# Patient Record
Sex: Female | Born: 1970 | Race: White | Hispanic: No | Marital: Married | Smoking: Never smoker
Health system: Southern US, Community
[De-identification: ages and names within clinical notes are randomized; demographics above are authoritative.]

## PROBLEM LIST (undated history)

## (undated) DIAGNOSIS — E11610 Type 2 diabetes mellitus with diabetic neuropathic arthropathy: Secondary | ICD-10-CM

## (undated) DIAGNOSIS — F419 Anxiety disorder, unspecified: Secondary | ICD-10-CM

## (undated) DIAGNOSIS — H269 Unspecified cataract: Secondary | ICD-10-CM

## (undated) DIAGNOSIS — I10 Essential (primary) hypertension: Secondary | ICD-10-CM

## (undated) DIAGNOSIS — Z789 Other specified health status: Secondary | ICD-10-CM

## (undated) DIAGNOSIS — R112 Nausea with vomiting, unspecified: Secondary | ICD-10-CM

## (undated) DIAGNOSIS — Z9889 Other specified postprocedural states: Secondary | ICD-10-CM

## (undated) DIAGNOSIS — E119 Type 2 diabetes mellitus without complications: Secondary | ICD-10-CM

## (undated) DIAGNOSIS — Z87442 Personal history of urinary calculi: Secondary | ICD-10-CM

## (undated) HISTORY — DX: Unspecified cataract: H26.9

## (undated) HISTORY — PX: NO PAST SURGERIES: SHX2092

## (undated) HISTORY — DX: Type 2 diabetes mellitus without complications: E11.9

## (undated) HISTORY — DX: Type 2 diabetes mellitus with diabetic neuropathic arthropathy: E11.610

## (undated) HISTORY — PX: CATARACT EXTRACTION: SUR2

---

## 1999-05-09 ENCOUNTER — Other Ambulatory Visit: Admission: RE | Admit: 1999-05-09 | Discharge: 1999-05-09 | Payer: Self-pay | Admitting: Obstetrics and Gynecology

## 2004-05-25 ENCOUNTER — Emergency Department (HOSPITAL_COMMUNITY): Admission: EM | Admit: 2004-05-25 | Discharge: 2004-05-26 | Payer: Self-pay | Admitting: Emergency Medicine

## 2017-08-30 ENCOUNTER — Encounter (HOSPITAL_COMMUNITY): Payer: Self-pay | Admitting: Emergency Medicine

## 2017-08-30 ENCOUNTER — Emergency Department (HOSPITAL_COMMUNITY)
Admission: EM | Admit: 2017-08-30 | Discharge: 2017-08-30 | Discharge status: Against Medical Advice | Payer: Self-pay | Source: Home / Self Care

## 2017-08-30 ENCOUNTER — Ambulatory Visit (INDEPENDENT_AMBULATORY_CARE_PROVIDER_SITE_OTHER): Payer: 59

## 2017-08-30 ENCOUNTER — Emergency Department (HOSPITAL_COMMUNITY)
Admission: EM | Admit: 2017-08-30 | Discharge: 2017-08-30 | Disposition: A | Discharge status: Home / Self Care | Payer: No Typology Code available for payment source | Attending: Emergency Medicine | Admitting: Emergency Medicine

## 2017-08-30 ENCOUNTER — Other Ambulatory Visit: Payer: Self-pay

## 2017-08-30 ENCOUNTER — Ambulatory Visit (HOSPITAL_COMMUNITY)
Admission: EM | Admit: 2017-08-30 | Discharge: 2017-08-30 | Disposition: A | Discharge status: Home / Self Care | Payer: 59 | Source: Home / Self Care | Attending: Family Medicine | Admitting: Family Medicine

## 2017-08-30 DIAGNOSIS — S42201A Unspecified fracture of upper end of right humerus, initial encounter for closed fracture: Secondary | ICD-10-CM

## 2017-08-30 DIAGNOSIS — W19XXXA Unspecified fall, initial encounter: Secondary | ICD-10-CM

## 2017-08-30 DIAGNOSIS — Y92018 Other place in single-family (private) house as the place of occurrence of the external cause: Secondary | ICD-10-CM | POA: Insufficient documentation

## 2017-08-30 DIAGNOSIS — Y9389 Activity, other specified: Secondary | ICD-10-CM | POA: Diagnosis not present

## 2017-08-30 DIAGNOSIS — S4992XA Unspecified injury of left shoulder and upper arm, initial encounter: Secondary | ICD-10-CM | POA: Diagnosis present

## 2017-08-30 DIAGNOSIS — S42352A Displaced comminuted fracture of shaft of humerus, left arm, initial encounter for closed fracture: Secondary | ICD-10-CM | POA: Insufficient documentation

## 2017-08-30 DIAGNOSIS — S42202A Unspecified fracture of upper end of left humerus, initial encounter for closed fracture: Secondary | ICD-10-CM

## 2017-08-30 DIAGNOSIS — Y999 Unspecified external cause status: Secondary | ICD-10-CM | POA: Diagnosis not present

## 2017-08-30 DIAGNOSIS — W01198A Fall on same level from slipping, tripping and stumbling with subsequent striking against other object, initial encounter: Secondary | ICD-10-CM | POA: Diagnosis not present

## 2017-08-30 MED ORDER — FENTANYL CITRATE (PF) 100 MCG/2ML IJ SOLN
50.0000 ug | Freq: Once | INTRAMUSCULAR | Status: AC
  Administered 2017-08-30: 50 ug via INTRAVENOUS
  Filled 2017-08-30: qty 2

## 2017-08-30 MED ORDER — PROMETHAZINE HCL 25 MG/ML IJ SOLN
12.5000 mg | Freq: Once | INTRAMUSCULAR | Status: DC
Start: 2017-08-30 — End: 2017-08-31
  Filled 2017-08-30: qty 1

## 2017-08-30 MED ORDER — MELOXICAM 15 MG PO TABS: 15.0000 mg | ORAL_TABLET | Freq: Every day | ORAL | 0 refills | Status: DC

## 2017-08-30 MED ORDER — OXYCODONE-ACETAMINOPHEN 5-325 MG PO TABS: 1.0000 | ORAL_TABLET | ORAL | 0 refills | Status: DC | PRN

## 2017-08-30 MED ORDER — ONDANSETRON HCL 4 MG/2ML IJ SOLN
4.0000 mg | Freq: Once | INTRAMUSCULAR | Status: AC
  Administered 2017-08-30: 4 mg via INTRAVENOUS
  Filled 2017-08-30: qty 2

## 2017-08-30 NOTE — ED Provider Notes (Signed)
[facility]    CSN: [id number] Arrival date & time: 08/30/17  1744     History   Chief Complaint Chief Complaint  Patient presents with  . Fall  . Arm Pain    HPI Becky MessierJulie S Townsend is a 47 y.o. female.   47 yo female here for fall onto left shoulder while doing laundry. She has been unable to move her arm due to pain since the fall. She has not taken anything for pain. No previous shoulder injury. Denies loss of sensation or muscle weakness.      History reviewed. No pertinent past medical history.  There are no active problems to display for this patient.   History reviewed. No pertinent surgical history.  OB History   None      Home Medications    Prior to Admission medications   Not on File    Family History History reviewed. No pertinent family history.  Social History Social History   Tobacco Use  . Smoking status: Never Smoker  . Smokeless tobacco: Never Used  Substance Use Topics  . Alcohol use: Never    Frequency: Never  . Drug use: Never     Allergies   Patient has no known allergies.   Review of Systems Review of Systems  Constitutional: Negative for activity change and appetite change.  HENT: Negative for congestion and dental problem.   Eyes: Negative for discharge.  Respiratory: Negative for apnea and choking.   Cardiovascular: Negative for chest pain and leg swelling.  Gastrointestinal: Negative for abdominal distention and abdominal pain.  Endocrine: Negative for cold intolerance and heat intolerance.  Genitourinary: Negative for difficulty urinating and dyspareunia.  Musculoskeletal: Negative for arthralgias and back pain.  Neurological: Negative for dizziness and headaches.  Hematological: Negative for adenopathy. Does not bruise/bleed easily.     Physical Exam Triage Vital Signs ED Triage Vitals  Enc Vitals Group     BP      Pulse      Resp      Temp      Temp src      SpO2      Weight      Height      Head Circumference      Peak Flow      Pain Score      Pain Loc      Pain Edu?      Excl. in GC?    No data found.  Updated Vital Signs BP 135/87 (BP Location: Right Arm)   Pulse (!) 106   Temp 98.9 F (37.2 C) (Oral)   Resp 18   SpO2 97%   Visual Acuity Right Eye Distance:   Left Eye Distance:   Bilateral Distance:    Right Eye Near:   Left Eye Near:    Bilateral Near:     Physical Exam  Constitutional: She is oriented to person, place, and time. She appears well-developed and well-nourished.  HENT:  Head: Normocephalic and atraumatic.  Eyes: Pupils are equal, round, and reactive to light. EOM are normal.  Neck: Normal range of motion. Neck supple.  Pulmonary/Chest: Effort normal. No respiratory distress.  Musculoskeletal:  Left shoulder: patient will not allow any movement of shoulder due to pain. The small amount of abduction I was able to create caused a loud "clunk" the patient is holding her arm close to her body and internally rotated. There is tenderness to palpation along the anterior shoulder  Neurological: She is  alert and oriented to person, place, and time.  Skin: Skin is warm and dry.  Psychiatric: She has a normal mood and affect. Her behavior is normal.     UC Treatments / Results  Labs (all labs ordered are listed, but only abnormal results are displayed) Labs Reviewed - No data to display  EKG None Radiology No results found.  Procedures Procedures (including critical care time)  Medications Ordered in UC Medications - No data to display   Initial Impression / Assessment and Plan / UC Course  I have reviewed the triage vital signs and the nursing notes.  Pertinent labs & imaging results that were available during my care of the patient were reviewed by me and considered in my medical decision making (see chart for details).     1. Humerus fracture- complex, so will send to ED for further evaluation and treatment  Final Clinical  Impressions(s) / UC Diagnoses   Final diagnoses:  None    ED Discharge Orders    None       Controlled Substance Prescriptions [place] Controlled Substance Registry consulted? Not Applicable   Rolm Bookbinder, DO 08/30/17 1851

## 2017-08-30 NOTE — ED Triage Notes (Signed)
Pt states she was in her laundry room and fell on her left arm  [facility] and told her she has a fractured humerous and sent her here  Pt has had 4 advil for pain

## 2017-08-30 NOTE — ED Triage Notes (Signed)
Pt sts left arm pain since falling today; pt unable to move arm due to pain

## 2017-08-30 NOTE — ED Provider Notes (Signed)
Medical screening examination/treatment/procedure(s) were conducted as a shared visit with non-physician practitioner(s) and myself.  I personally evaluated the patient during the encounter. Briefly, the patient is a 47 y.o. female here with right humeral shaft fracture following a mechanical fall.  Denies any other trauma.  No other trauma noted on exam.  Case discussed with Dr Lequita HaltAluisio who recommended co-op splint and clinic follow-up this week. The patient appears reasonably screened and/or stabilized for discharge and I doubt any other medical condition or other [facility] requiring further screening, evaluation, or treatment in the ED at this time prior to discharge. The patient is safe for discharge with strict return precautions. .    EKG Interpretation None           Cardama, Amadeo GarnetPedro Eduardo, MD 08/31/17 [phone]

## 2017-08-30 NOTE — Discharge Instructions (Addendum)
Contact a health care provider if: °You have any new pain, swelling, or bruising. °Your pain, swelling, and bruising do not improve. °Your cast, splint, or sling becomes loose or damaged. °Get help right away if: °Your skin or fingers on your injured arm turn blue or gray. °Your arm feels cold or numb. °You have severe pain in your injured arm. °

## 2017-08-30 NOTE — ED Provider Notes (Signed)
[facility]-EMERGENCY DEPT Provider Note   CSN: [id number] Arrival date & time: 08/30/17  1915     History   Chief Complaint Chief Complaint  Patient presents with  . Arm Injury    HPI Becky Townsend is a 47 y.o. female who presents the emergency department with chief complaint of right humeral fracture.  Patient slipped in her laundry room this evening and fell onto her left outstretched hand.  She states that when she got back up she was unable to move her left arm.  She denies numbness, tingling.  She is a confirmed fracture at the urgent care.  Lose consciousness.  HPI  History reviewed. No pertinent past medical history.  There are no active problems to display for this patient.   History reviewed. No pertinent surgical history.   OB History   None      Home Medications    Prior to Admission medications   Not on File    Family History Family History  Problem Relation Age of Onset  . Hypertension Father     Social History Social History   Tobacco Use  . Smoking status: Never Smoker  . Smokeless tobacco: Never Used  Substance Use Topics  . Alcohol use: Never    Frequency: Never  . Drug use: Never     Allergies   Patient has no known allergies.   Review of Systems Review of Systems  Ten systems reviewed and are negative for acute change, except as noted in the HPI.   Physical Exam Updated Vital Signs BP (!) 137/97 (BP Location: Right Arm)   Pulse (!) 112   Temp 98.5 F (36.9 C) (Oral)   Resp 16   Ht 5\' 1"  (1.549 m)   Wt 102.1 kg (225 lb)   LMP 08/08/2017 (Approximate)   SpO2 96%   BMI 42.51 kg/m   Physical Exam  Constitutional: She is oriented to person, place, and time. She appears well-developed and well-nourished. No distress.  HENT:  Head: Normocephalic and atraumatic.  Eyes: Conjunctivae are normal. No scleral icterus.  Neck: Normal range of motion.  Cardiovascular: Normal rate, regular rhythm and  normal heart sounds. Exam reveals no gallop and no friction rub.  No murmur heard. Pulmonary/Chest: Effort normal and breath sounds normal. No respiratory distress.  Abdominal: Soft. Bowel sounds are normal. She exhibits no distension and no mass. There is no tenderness. There is no guarding.  Musculoskeletal:  Left upper arm with significant swelling and tenderness.  Exquisitely tender to palpation without overt deformity.  Normal left radial pulse, no bony tenderness to palpation of the left elbow, normal wrist and finger movement  Neurological: She is alert and oriented to person, place, and time.  Skin: Skin is warm and dry. She is not diaphoretic.  Psychiatric: Her behavior is normal.  Nursing note and vitals reviewed.    ED Treatments / Results  Labs (all labs ordered are listed, but only abnormal results are displayed) Labs Reviewed - No data to display  EKG None  Radiology Dg Shoulder Left  Result Date: 08/30/2017 CLINICAL DATA:  Fall with left shoulder pain EXAM: LEFT SHOULDER - 2+ VIEW COMPARISON:  None. FINDINGS: AC joint is intact. Acute, comminuted fracture involving the proximal to midshaft of the humerus. No significant angulation. Just under 1 bone with of displacement of distal fracture fragment away from midline. Fracture fragments are separated by approximately 9 mm. IMPRESSION: Acute, comminuted and displaced fracture involving the proximal to midshaft  of the left humerus. Electronically Signed   By: Jasmine PangKim  Fujinaga M.D.   On: 08/30/2017 19:28    Procedures Procedures (including critical care time)  SPLINT APPLICATION Date/Time: 11:07 PM Authorized by: Arthor CaptainAbigail Alzena Gerber Consent: Verbal consent obtained. Risks and benefits: risks, benefits and alternatives were discussed Consent given by: patient Splint applied by: orthopedic technician Location details: left arm Splint type: coaptation Supplies used: fiberglass Post-procedure: The splinted body part was  neurovascularly unchanged following the procedure. Patient tolerance: Patient tolerated the procedure well with no immediate complications.    Medications Ordered in ED Medications - No data to display   Initial Impression / Assessment and Plan / ED Course  I have reviewed the triage vital signs and the nursing notes.  Pertinent labs & imaging results that were available during my care of the patient were reviewed by me and considered in my medical decision making (see chart for details).     Patient placed in a coaptation splint.  Spoke with Dr. Lequita HaltAluisio who recommends outpatient follow-up. The patient will be discharged with sling and pain medication. Discussed return recautions  Final Clinical Impressions(s) / ED Diagnoses   Final diagnoses:  None    ED Discharge Orders    None       Arthor CaptainHarris, Carlton Sweaney, PA-C 08/30/17 2309

## 2018-06-20 NOTE — Pre-Procedure Instructions (Signed)
Becky Townsend  06/20/2018      [id number] [place] Otto, [place] - [address] AT [address] [place] [address] Phone: [phone] Fax: [phone]    Your procedure is scheduled on Tuesday, Feb. 4th   Report to [facility] Admitting at 10:45 AM             (posted surgery time 12:45p - 2:45p)   Call this number if you have problems the morning of surgery:  [phone]   Remember:   Do not eat any foods or drink any liquids after midnight, Monday.              7 days prior to surgery, STOP TAKING any Vitamins, Herbal Supplements, Anti=inflammatories, Blood Thinners.    Take these medicines the morning of surgery with A SIP OF WATER : nothing    Do not wear jewelry, make-up or nail polish.  Do not wear lotions, powders, or perfumes, or deodorant.  Do not shave 48 hours prior to surgery.   Do not bring valuables to the hospital.  [facility] is not responsible for any belongings or valuables.  Contacts, dentures or bridgework may not be worn into surgery.  Leave your suitcase in the car.  After surgery it may be brought to your room.  For patients admitted to the hospital, discharge time will be determined by your treatment team.  Patients discharged the day of surgery will not be allowed to drive home, and will need someone to stay with you for the first 24 hrs.   Please read over the following fact sheets that you were given. Pain Booklet and Surgical Site Infection Prevention       [facility]- Preparing For Surgery  Before surgery, you can play an important role. Because skin is not sterile, your skin needs to be as free of germs as possible. You can reduce the number of germs on your skin by washing with CHG (chlorahexidine gluconate) Soap before surgery.  CHG is an antiseptic cleaner which kills germs and bonds with the skin to continue killing germs even after washing.     Oral Hygiene is also important to reduce your risk of infection.    Remember - BRUSH YOUR TEETH THE MORNING OF SURGERY WITH YOUR REGULAR TOOTHPASTE  Please do not use if you have an allergy to CHG or antibacterial soaps. If your skin becomes reddened/irritated stop using the CHG.  Do not shave (including legs and underarms) for at least 48 hours prior to first CHG shower. It is OK to shave your face.  Please follow these instructions carefully.   1. Shower the NIGHT BEFORE SURGERY and the MORNING OF SURGERY with CHG.   2. If you chose to wash your hair, wash your hair first as usual with your normal shampoo.  3. After you shampoo, rinse your hair and body thoroughly to remove the shampoo.  4. Use CHG as you would any other liquid soap. You can apply CHG directly to the skin and wash gently with a scrungie or a clean washcloth.   5. Apply the CHG Soap to your body ONLY FROM THE NECK DOWN.  Do not use on open wounds or open sores. Avoid contact with your eyes, ears, mouth and genitals (private parts). Wash Face and genitals (private parts)  with your normal soap.  6. Wash thoroughly, paying special attention to the area where  your surgery will be performed.  7. Thoroughly rinse your body with warm water from the neck down.  8. DO NOT shower/wash with your normal soap after using and rinsing off the CHG Soap.  9. Pat yourself dry with a CLEAN TOWEL.  10. Wear CLEAN PAJAMAS to bed the night before surgery, wear comfortable clothes the morning of surgery  11. Place CLEAN SHEETS on your bed the night of your first shower and DO NOT SLEEP WITH PETS.  Day of Surgery:  Do not apply any deodorants/lotions.  Please wear clean clothes to the hospital/surgery center.    Remember to brush your teeth WITH YOUR REGULAR TOOTHPASTE.  PLEASE RE-READ OVER THESE FACT SHEETS

## 2018-06-21 ENCOUNTER — Encounter (HOSPITAL_COMMUNITY): Payer: Self-pay

## 2018-06-21 ENCOUNTER — Other Ambulatory Visit: Payer: Self-pay

## 2018-06-21 ENCOUNTER — Encounter (HOSPITAL_COMMUNITY)
Admission: RE | Admit: 2018-06-21 | Discharge: 2018-06-21 | Disposition: A | Discharge status: Home / Self Care | Payer: 59 | Source: Ambulatory Visit | Attending: Orthopedic Surgery | Admitting: Orthopedic Surgery

## 2018-06-21 DIAGNOSIS — Z01812 Encounter for preprocedural laboratory examination: Secondary | ICD-10-CM | POA: Insufficient documentation

## 2018-06-21 HISTORY — DX: Nausea with vomiting, unspecified: R11.2

## 2018-06-21 HISTORY — DX: Anxiety disorder, unspecified: F41.9

## 2018-06-21 HISTORY — DX: Personal history of urinary calculi: Z87.442

## 2018-06-21 HISTORY — DX: Other specified postprocedural states: Z98.890

## 2018-06-21 HISTORY — DX: Other specified health status: Z78.9

## 2018-06-21 LAB — CBC
HCT: 41.6 % (ref 36.0–46.0)
Hemoglobin: 13.9 g/dL (ref 12.0–15.0)
MCH: 29.1 pg (ref 26.0–34.0)
MCHC: 33.4 g/dL (ref 30.0–36.0)
MCV: 87.2 fL (ref 80.0–100.0)
NRBC: 0 % (ref 0.0–0.2)
PLATELETS: 235 10*3/uL (ref 150–400)
RBC: 4.77 MIL/uL (ref 3.87–5.11)
RDW: 13.7 % (ref 11.5–15.5)
WBC: 8.2 10*3/uL (ref 4.0–10.5)

## 2018-06-27 ENCOUNTER — Encounter (HOSPITAL_COMMUNITY): Payer: Self-pay | Admitting: Registered Nurse

## 2018-06-28 ENCOUNTER — Encounter (HOSPITAL_COMMUNITY): Payer: Self-pay | Admitting: *Deleted

## 2018-06-28 ENCOUNTER — Encounter (HOSPITAL_COMMUNITY)
Admission: RE | Disposition: A | Discharge status: Home / Self Care | Payer: Self-pay | Source: Ambulatory Visit | Attending: Orthopedic Surgery

## 2018-06-28 ENCOUNTER — Observation Stay (HOSPITAL_COMMUNITY)
Admission: RE | Admit: 2018-06-28 | Discharge: 2018-06-29 | Disposition: A | Discharge status: Home / Self Care | Payer: No Typology Code available for payment source | Source: Ambulatory Visit | Attending: Orthopedic Surgery | Admitting: Orthopedic Surgery

## 2018-06-28 ENCOUNTER — Ambulatory Visit (HOSPITAL_COMMUNITY): Payer: No Typology Code available for payment source | Admitting: Registered Nurse

## 2018-06-28 ENCOUNTER — Ambulatory Visit (HOSPITAL_COMMUNITY): Payer: No Typology Code available for payment source

## 2018-06-28 ENCOUNTER — Other Ambulatory Visit: Payer: Self-pay

## 2018-06-28 DIAGNOSIS — Z885 Allergy status to narcotic agent status: Secondary | ICD-10-CM | POA: Insufficient documentation

## 2018-06-28 DIAGNOSIS — S42302K Unspecified fracture of shaft of humerus, left arm, subsequent encounter for fracture with nonunion: Secondary | ICD-10-CM | POA: Diagnosis present

## 2018-06-28 DIAGNOSIS — Y990 Civilian activity done for income or pay: Secondary | ICD-10-CM | POA: Diagnosis not present

## 2018-06-28 DIAGNOSIS — Z6841 Body Mass Index (BMI) 40.0 and over, adult: Secondary | ICD-10-CM | POA: Insufficient documentation

## 2018-06-28 DIAGNOSIS — Z419 Encounter for procedure for purposes other than remedying health state, unspecified: Secondary | ICD-10-CM

## 2018-06-28 DIAGNOSIS — Z79899 Other long term (current) drug therapy: Secondary | ICD-10-CM | POA: Insufficient documentation

## 2018-06-28 DIAGNOSIS — S42362K Displaced segmental fracture of shaft of humerus, left arm, subsequent encounter for fracture with nonunion: Secondary | ICD-10-CM | POA: Diagnosis present

## 2018-06-28 DIAGNOSIS — S42352K Displaced comminuted fracture of shaft of humerus, left arm, subsequent encounter for fracture with nonunion: Secondary | ICD-10-CM | POA: Diagnosis present

## 2018-06-28 HISTORY — PX: ORIF HUMERUS FRACTURE: SHX2126

## 2018-06-28 LAB — POCT PREGNANCY, URINE: Preg Test, Ur: NEGATIVE

## 2018-06-28 SURGERY — OPEN REDUCTION INTERNAL FIXATION (ORIF) PROXIMAL HUMERUS FRACTURE
Anesthesia: General | Laterality: Left

## 2018-06-28 MED ORDER — ACETAMINOPHEN 650 MG RE SUPP: 650.0000 mg | Freq: Four times a day (QID) | RECTAL | Status: DC | PRN

## 2018-06-28 MED ORDER — SODIUM CHLORIDE 0.9 % IR SOLN
Status: DC | PRN
  Administered 2018-06-28: 1000 mL

## 2018-06-28 MED ORDER — OXYCODONE HCL 5 MG PO TABS: 5.0000 mg | ORAL_TABLET | ORAL | Status: DC | PRN

## 2018-06-28 MED ORDER — CHLORHEXIDINE GLUCONATE 4 % EX LIQD: 60.0000 mL | Freq: Once | CUTANEOUS | Status: DC

## 2018-06-28 MED ORDER — ACETAMINOPHEN 325 MG PO TABS: 650.0000 mg | ORAL_TABLET | Freq: Four times a day (QID) | ORAL | Status: DC | PRN

## 2018-06-28 MED ORDER — DEXAMETHASONE SODIUM PHOSPHATE 10 MG/ML IJ SOLN
INTRAMUSCULAR | Status: DC | PRN
  Administered 2018-06-28: 10 mg via INTRAVENOUS

## 2018-06-28 MED ORDER — MIDAZOLAM HCL 2 MG/2ML IJ SOLN
2.0000 mg | Freq: Once | INTRAMUSCULAR | Status: AC
  Administered 2018-06-28: 2 mg via INTRAVENOUS

## 2018-06-28 MED ORDER — LACTATED RINGERS IV SOLN
INTRAVENOUS | Status: DC
  Administered 2018-06-28: 11:00:00 via INTRAVENOUS

## 2018-06-28 MED ORDER — BUPIVACAINE-EPINEPHRINE (PF) 0.5% -1:200000 IJ SOLN
INTRAMUSCULAR | Status: DC | PRN
  Administered 2018-06-28: 20 mL via PERINEURAL

## 2018-06-28 MED ORDER — METHOCARBAMOL 500 MG PO TABS: 500.0000 mg | ORAL_TABLET | Freq: Four times a day (QID) | ORAL | Status: DC | PRN

## 2018-06-28 MED ORDER — ACETAMINOPHEN 500 MG PO TABS: 1000.0000 mg | ORAL_TABLET | Freq: Four times a day (QID) | ORAL | Status: DC | PRN

## 2018-06-28 MED ORDER — FENTANYL CITRATE (PF) 250 MCG/5ML IJ SOLN
INTRAMUSCULAR | Status: AC
  Filled 2018-06-28: qty 5

## 2018-06-28 MED ORDER — MORPHINE SULFATE (PF) 2 MG/ML IV SOLN: 2.0000 mg | INTRAVENOUS | Status: DC | PRN

## 2018-06-28 MED ORDER — HYDROMORPHONE HCL 1 MG/ML IJ SOLN: 0.2500 mg | INTRAMUSCULAR | Status: DC | PRN

## 2018-06-28 MED ORDER — CEFAZOLIN SODIUM-DEXTROSE 2-4 GM/100ML-% IV SOLN
2.0000 g | INTRAVENOUS | Status: AC
  Administered 2018-06-28: 2 g via INTRAVENOUS
  Filled 2018-06-28: qty 100

## 2018-06-28 MED ORDER — ONDANSETRON HCL 4 MG/2ML IJ SOLN
INTRAMUSCULAR | Status: DC | PRN
  Administered 2018-06-28: 4 mg via INTRAVENOUS

## 2018-06-28 MED ORDER — MIDAZOLAM HCL 2 MG/2ML IJ SOLN
INTRAMUSCULAR | Status: AC
  Administered 2018-06-28: 2 mg via INTRAVENOUS
  Filled 2018-06-28: qty 2

## 2018-06-28 MED ORDER — ONDANSETRON HCL 4 MG/2ML IJ SOLN
4.0000 mg | Freq: Four times a day (QID) | INTRAMUSCULAR | Status: DC | PRN
  Filled 2018-06-28: qty 2

## 2018-06-28 MED ORDER — PROPOFOL 10 MG/ML IV BOLUS
INTRAVENOUS | Status: AC
  Filled 2018-06-28: qty 20

## 2018-06-28 MED ORDER — PROPOFOL 10 MG/ML IV BOLUS
INTRAVENOUS | Status: DC | PRN
  Administered 2018-06-28: 150 mg via INTRAVENOUS
  Administered 2018-06-28: 50 mg via INTRAVENOUS

## 2018-06-28 MED ORDER — BUPIVACAINE-EPINEPHRINE 0.5% -1:200000 IJ SOLN
INTRAMUSCULAR | Status: AC
  Filled 2018-06-28: qty 1

## 2018-06-28 MED ORDER — ROCURONIUM BROMIDE 10 MG/ML (PF) SYRINGE
PREFILLED_SYRINGE | INTRAVENOUS | Status: DC | PRN
  Administered 2018-06-28: 50 mg via INTRAVENOUS

## 2018-06-28 MED ORDER — MIDAZOLAM HCL 2 MG/2ML IJ SOLN
INTRAMUSCULAR | Status: AC
  Filled 2018-06-28: qty 2

## 2018-06-28 MED ORDER — ROCURONIUM BROMIDE 50 MG/5ML IV SOSY
PREFILLED_SYRINGE | INTRAVENOUS | Status: AC
  Filled 2018-06-28: qty 10

## 2018-06-28 MED ORDER — ACETAMINOPHEN 500 MG PO TABS
1000.0000 mg | ORAL_TABLET | Freq: Four times a day (QID) | ORAL | Status: DC
  Administered 2018-06-28 – 2018-06-29 (×3): 1000 mg via ORAL
  Filled 2018-06-28 (×3): qty 2

## 2018-06-28 MED ORDER — LIDOCAINE 2% (20 MG/ML) 5 ML SYRINGE
INTRAMUSCULAR | Status: AC
  Filled 2018-06-28: qty 5

## 2018-06-28 MED ORDER — ACETAMINOPHEN 500 MG PO TABS
1000.0000 mg | ORAL_TABLET | Freq: Once | ORAL | Status: AC
  Administered 2018-06-28: 1000 mg via ORAL
  Filled 2018-06-28: qty 2

## 2018-06-28 MED ORDER — BUPIVACAINE-EPINEPHRINE 0.25% -1:200000 IJ SOLN
INTRAMUSCULAR | Status: DC | PRN
  Administered 2018-06-28: 20 mL

## 2018-06-28 MED ORDER — BUPIVACAINE LIPOSOME 1.3 % IJ SUSP
INTRAMUSCULAR | Status: DC | PRN
  Administered 2018-06-28: 10 mL via PERINEURAL

## 2018-06-28 MED ORDER — SODIUM CHLORIDE 0.9 % IV SOLN
INTRAVENOUS | Status: DC | PRN
  Administered 2018-06-28: 25 ug/min via INTRAVENOUS

## 2018-06-28 MED ORDER — FENTANYL CITRATE (PF) 100 MCG/2ML IJ SOLN
INTRAMUSCULAR | Status: AC
  Filled 2018-06-28: qty 2

## 2018-06-28 MED ORDER — ONDANSETRON HCL 4 MG/2ML IJ SOLN
INTRAMUSCULAR | Status: AC
  Filled 2018-06-28: qty 2

## 2018-06-28 MED ORDER — TRANEXAMIC ACID-NACL 1000-0.7 MG/100ML-% IV SOLN
1000.0000 mg | INTRAVENOUS | Status: AC
  Administered 2018-06-28: 1000 mg via INTRAVENOUS
  Filled 2018-06-28: qty 100

## 2018-06-28 MED ORDER — FENTANYL CITRATE (PF) 250 MCG/5ML IJ SOLN
INTRAMUSCULAR | Status: DC | PRN
  Administered 2018-06-28: 50 ug via INTRAVENOUS

## 2018-06-28 MED ORDER — LIDOCAINE 2% (20 MG/ML) 5 ML SYRINGE
INTRAMUSCULAR | Status: DC | PRN
  Administered 2018-06-28: 60 mg via INTRAVENOUS

## 2018-06-28 MED ORDER — METHOCARBAMOL 1000 MG/10ML IJ SOLN
500.0000 mg | Freq: Four times a day (QID) | INTRAVENOUS | Status: DC | PRN
  Filled 2018-06-28: qty 5

## 2018-06-28 MED ORDER — ONDANSETRON HCL 4 MG PO TABS: 4.0000 mg | ORAL_TABLET | Freq: Four times a day (QID) | ORAL | Status: DC | PRN

## 2018-06-28 MED ORDER — TRANEXAMIC ACID 1000 MG/10ML IV SOLN
INTRAVENOUS | Status: DC | PRN
  Administered 2018-06-28: 2000 mg via TOPICAL

## 2018-06-28 MED ORDER — SUGAMMADEX SODIUM 200 MG/2ML IV SOLN
INTRAVENOUS | Status: DC | PRN
  Administered 2018-06-28: 200 mg via INTRAVENOUS

## 2018-06-28 MED ORDER — TRANEXAMIC ACID 1000 MG/10ML IV SOLN
2000.0000 mg | Freq: Once | INTRAVENOUS | Status: DC
  Filled 2018-06-28: qty 20

## 2018-06-28 SURGICAL SUPPLY — 76 items
ALCOHOL 70% 16 OZ (MISCELLANEOUS) ×3 IMPLANT
BANDAGE ELASTIC 4 VELCRO ST LF (GAUZE/BANDAGES/DRESSINGS) ×3 IMPLANT
BIT DRILL 2.5 X LONG (BIT) ×1
BIT DRILL 5/64X5 DISP (BIT) ×3 IMPLANT
BIT DRILL PERC QC 2.8X200 100 (BIT) IMPLANT
BIT DRILL X LONG 2.5 (BIT) IMPLANT
BNDG CMPR MED 10X6 ELC LF (GAUZE/BANDAGES/DRESSINGS) ×1
BNDG ELASTIC 6X10 VLCR STRL LF (GAUZE/BANDAGES/DRESSINGS) ×2 IMPLANT
CLOSURE WOUND 1/2 X4 (GAUZE/BANDAGES/DRESSINGS) ×1
COVER SURGICAL LIGHT HANDLE (MISCELLANEOUS) ×3 IMPLANT
COVER WAND RF STERILE (DRAPES) ×3 IMPLANT
DRAPE INCISE IOBAN 66X45 STRL (DRAPES) ×3 IMPLANT
DRAPE ORTHO SPLIT 77X108 STRL (DRAPES) ×6
DRAPE SURG ORHT 6 SPLT 77X108 (DRAPES) ×2 IMPLANT
DRAPE U-SHAPE 47X51 STRL (DRAPES) ×3 IMPLANT
DRILL BIT QUICK COUP 2.8MM 100 (BIT) ×2
DRILL BIT X LONG 2.5 (BIT) ×3
DRSG ADAPTIC 3X8 NADH LF (GAUZE/BANDAGES/DRESSINGS) ×3 IMPLANT
DRSG PAD ABDOMINAL 8X10 ST (GAUZE/BANDAGES/DRESSINGS) ×3 IMPLANT
DURAPREP 26ML APPLICATOR (WOUND CARE) ×3 IMPLANT
ELECT BLADE 4.0 EZ CLEAN MEGAD (MISCELLANEOUS) ×3
ELECT REM PT RETURN 9FT ADLT (ELECTROSURGICAL) ×3
ELECTRODE BLDE 4.0 EZ CLN MEGD (MISCELLANEOUS) ×1 IMPLANT
ELECTRODE REM PT RTRN 9FT ADLT (ELECTROSURGICAL) ×1 IMPLANT
GAUZE SPONGE 4X4 12PLY STRL (GAUZE/BANDAGES/DRESSINGS) ×3 IMPLANT
GAUZE XEROFORM 5X9 LF (GAUZE/BANDAGES/DRESSINGS) ×2 IMPLANT
GLOVE BIO SURGEON STRL SZ7.5 (GLOVE) ×3 IMPLANT
GLOVE BIOGEL PI IND STRL 8 (GLOVE) ×1 IMPLANT
GLOVE BIOGEL PI INDICATOR 8 (GLOVE) ×2
GOWN STRL REUS W/ TWL LRG LVL3 (GOWN DISPOSABLE) ×1 IMPLANT
GOWN STRL REUS W/ TWL XL LVL3 (GOWN DISPOSABLE) ×2 IMPLANT
GOWN STRL REUS W/TWL LRG LVL3 (GOWN DISPOSABLE) ×3
GOWN STRL REUS W/TWL XL LVL3 (GOWN DISPOSABLE) ×6
HUMERUS PROX LCP 8H 3.5X196 (Shoulder) ×2 IMPLANT
KIT BASIN OR (CUSTOM PROCEDURE TRAY) ×3 IMPLANT
KIT TURNOVER KIT B (KITS) ×3 IMPLANT
MANIFOLD NEPTUNE II (INSTRUMENTS) ×3 IMPLANT
NDL 1/2 CIR MAYO (NEEDLE) ×1 IMPLANT
NDL HYPO 25GX1X1/2 BEV (NEEDLE) ×1 IMPLANT
NEEDLE 1/2 CIR MAYO (NEEDLE) ×3 IMPLANT
NEEDLE HYPO 25GX1X1/2 BEV (NEEDLE) ×3 IMPLANT
NS IRRIG 1000ML POUR BTL (IV SOLUTION) ×3 IMPLANT
PACK SHOULDER (CUSTOM PROCEDURE TRAY) ×3 IMPLANT
PAD ABD 8X10 STRL (GAUZE/BANDAGES/DRESSINGS) ×4 IMPLANT
PAD ARMBOARD 7.5X6 YLW CONV (MISCELLANEOUS) ×6 IMPLANT
SCREW CORTEX 2.0 34MM (Screw) ×2 IMPLANT
SCREW CORTEX 3.5 18MM (Screw) ×2 IMPLANT
SCREW CORTEX 3.5 28MM (Screw) ×2 IMPLANT
SCREW CORTEX 3.5 30MM (Screw) ×2 IMPLANT
SCREW LOCK CORT ST 3.5X18 (Screw) IMPLANT
SCREW LOCK CORT ST 3.5X28 (Screw) IMPLANT
SCREW LOCK CORT ST 3.5X30 (Screw) IMPLANT
SCREW LOCK T15 FT 28X3.5X2.9X (Screw) IMPLANT
SCREW LOCK T15 FT 32X3.5X2.9X (Screw) IMPLANT
SCREW LOCK T15 FT 36X3.5X2.9X (Screw) IMPLANT
SCREW LOCKING 3.5X26 (Screw) ×4 IMPLANT
SCREW LOCKING 3.5X28 (Screw) ×3 IMPLANT
SCREW LOCKING 3.5X32 (Screw) ×6 IMPLANT
SCREW LOCKING 3.5X36 (Screw) ×3 IMPLANT
SCREW LOCKING 3.5X42 (Screw) ×4 IMPLANT
SLING ARM FOAM STRAP LRG (SOFTGOODS) IMPLANT
SPONGE LAP 18X18 X RAY DECT (DISPOSABLE) IMPLANT
SPONGE LAP 4X18 RFD (DISPOSABLE) ×3 IMPLANT
STRIP CLOSURE SKIN 1/2X4 (GAUZE/BANDAGES/DRESSINGS) ×2 IMPLANT
SUCTION FRAZIER HANDLE 10FR (MISCELLANEOUS) ×2
SUCTION TUBE FRAZIER 10FR DISP (MISCELLANEOUS) ×1 IMPLANT
SUT FIBERWIRE #2 38 T-5 BLUE (SUTURE) ×6
SUT MNCRL AB 4-0 PS2 18 (SUTURE) ×3 IMPLANT
SUT VIC AB 2-0 CT1 27 (SUTURE) ×3
SUT VIC AB 2-0 CT1 TAPERPNT 27 (SUTURE) ×1 IMPLANT
SUTURE FIBERWR #2 38 T-5 BLUE (SUTURE) ×2 IMPLANT
SYR CONTROL 10ML LL (SYRINGE) ×3 IMPLANT
TOWEL OR 17X24 6PK STRL BLUE (TOWEL DISPOSABLE) ×3 IMPLANT
TOWEL OR 17X26 10 PK STRL BLUE (TOWEL DISPOSABLE) ×3 IMPLANT
WATER STERILE IRR 1000ML POUR (IV SOLUTION) ×3 IMPLANT
YANKAUER SUCT BULB TIP NO VENT (SUCTIONS) ×3 IMPLANT

## 2018-06-28 NOTE — Anesthesia Procedure Notes (Signed)
Procedure Name: Intubation Date/Time: 06/28/2018 12:42 PM Performed by: Valda Favia, CRNA Pre-anesthesia Checklist: Patient identified, Emergency Drugs available, Suction available and Patient being monitored Patient Re-evaluated:Patient Re-evaluated prior to induction Oxygen Delivery Method: Circle System Utilized Preoxygenation: Pre-oxygenation with 100% oxygen Induction Type: IV induction Ventilation: Mask ventilation without difficulty Laryngoscope Size: Mac and 4 Grade View: Grade I Tube type: Oral Tube size: 7.0 mm Number of attempts: 1 Airway Equipment and Method: Stylet and Oral airway Placement Confirmation: ETT inserted through vocal cords under direct vision,  positive ETCO2 and breath sounds checked- equal and bilateral Secured at: 21 cm Tube secured with: Tape Dental Injury: Teeth and Oropharynx as per pre-operative assessment

## 2018-06-28 NOTE — Op Note (Signed)
Date of Surgery: 06/28/2018  INDICATIONS: Becky Townsend is a 48 y.o.-year-old female with a left comminuted midshaft humerus nonunion.  She is a right-hand-dominant individual who sustained a work-related injury about 9 months ago.  She had a fracture of the left humeral shaft that was comminuted and midshaft that was indicated for closed management.  She opted for closed management.  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  She presents today for nonunion takedown and internal fixation of the humeral shaft.;  The patient did consent to the procedure after discussion of the risks and benefits.  PREOPERATIVE DIAGNOSIS:  Left humeral shaft fracture, nonunion.  POSTOPERATIVE DIAGNOSIS: Same.  PROCEDURE:  1.  Open reduction and internal fixation of chronic nonunited humeral shaft fracture  SURGEON: Maryan Rued, M.D.  ASSIST: Patrick Jupiter, RNFA.  ANESTHESIA:  general, and interscalene  IV FLUIDS AND URINE: See anesthesia.  ESTIMATED BLOOD LOSS: 100 mL.  IMPLANTS: Synthes stainless steel 3.5 mm 8 hole proximal humeral plate. 3.5 mm locking and nonlocking screws.  DRAINS: None  COMPLICATIONS: None.  DESCRIPTION OF PROCEDURE: The patient was brought to the operating room and placed supine on the operating table.  The patient had been signed prior to the procedure and this was documented. The patient had the anesthesia placed by the anesthesiologist.  A time-out was performed to confirm that this was the correct patient, site, side and location. The patient did receive antibiotics prior to the incision and was re-dosed during the procedure as needed at indicated intervals.  A tourniquet was not placed.  The patient had the operative extremity prepped and draped in the standard surgical fashion.      An extended deltopectoral incision was carried out into the anterior lateral aspect of the midshaft humerus.  Dissection was carried down through skin, subcutaneous tissue,  deltopectoral fascia and into the subdeltoid and subpectoralis space proximally.  The clavipectoral fascia was opened and retractors were placed deep.  We then continued distally to the deltoid tuberosity.  Dissection was then carried along the lateral aspect of the biceps brachialis and this was swept medially with blunt retractors.  We then split the brachialis in line with its fibers along the fracture site and distally.  Next we identified the chronic nonunion site of the midshaft of the humerus.  There was abundant fibrous nonunion material interposed between the long oblique fracture fragment.  This was taken down with rondure and Bovie.  Care was taken to avoid neurovascular structures.  We did address some neovascularization with electrocautery to control bleeding.   Next we were able to place 2 point-to-point clamps along the long oblique fracture segment.  The appropriate length plate was selected which was an 8 hole plate.  This was secured proximally with 1.6 mm Kirschner wires and distally with the same.  Fluoroscopy was used to confirm adequacy of the reduction and placement of the plate.  Once we were satisfied with the reduction and plate placement we began to secure the plate to the bone.  We began with placing a 3.5 mm nonlocking screw distal to the fracture and did the same with a nonlocking screw proximal to the fracture.  This reduced the plate nicely to the bone.  We then secured it proximally with locking screws into the humeral head.  We then placed 2 more proximal locking screws into the shaft and then 3 more locking screws distally into the shaft.  These all have excellent bite and purchase.   Finally we  confirmed adequacy of the plate placement and reduction with AP and lateral intraoperative fluoroscopy.  All screw lengths were checked and were appropriate as well as the reduction.  The wound was then copiously irrigated with 1 L of normal saline.  We then closed the fascia  proximally with 0 Vicryl.  We then closed fashion distally with 0 Vicryl.  We placed a 0 Vicryl running stitch in the deep subcutaneous fat layer.  We then placed 2-0 Vicryl into the deep dermal layer and staples were placed for skin.  A standard sterile dressing was applied after the arm was cleaned and dried one final time.  She was awakened from general anesthesia in stable condition.  A sling was applied to the arm.  All counts were correct x2.  POSTOPERATIVE PLAN:   Dore will be nonweightbearing to the left upper extremity.  She will wear her sling for comfort only and may remove it to begin active range of motion and passive range of motion as tolerated at the shoulder and elbow as well as hand and wrist.  She may begin showering immediately with her occlusive dressing.  This will maintain in place until her follow-up appointment.  I will see her back in the office in 2 weeks.  She will be admitted postoperatively under observation status for pain control and postoperative care.

## 2018-06-28 NOTE — Anesthesia Preprocedure Evaluation (Addendum)
Anesthesia Evaluation  Patient identified by MRN, date of birth, ID band Patient awake    Reviewed: Allergy & Precautions, H&P , NPO status , Patient's Chart, lab work & pertinent test results  History of Anesthesia Complications (+) PONV  Airway Mallampati: II  TM Distance: >3 FB Neck ROM: Full    Dental no notable dental hx. (+) Teeth Intact, Dental Advisory Given   Pulmonary neg pulmonary ROS,    Pulmonary exam normal breath sounds clear to auscultation       Cardiovascular negative cardio ROS   Rhythm:Regular Rate:Normal     Neuro/Psych Anxiety negative neurological ROS     GI/Hepatic negative GI ROS, Neg liver ROS,   Endo/Other  Morbid obesity  Renal/GU negative Renal ROS  negative genitourinary   Musculoskeletal   Abdominal   Peds  Hematology negative hematology ROS (+)   Anesthesia Other Findings   Reproductive/Obstetrics negative OB ROS                            Anesthesia Physical Anesthesia Plan  ASA: III  Anesthesia Plan: General   Post-op Pain Management:  Regional for Post-op pain   Induction: Intravenous  PONV Risk Score and Plan: 4 or greater and Ondansetron, Dexamethasone and Midazolam  Airway Management Planned: Oral ETT  Additional Equipment:   Intra-op Plan:   Post-operative Plan: Extubation in OR  Informed Consent: I have reviewed the patients History and Physical, chart, labs and discussed the procedure including the risks, benefits and alternatives for the proposed anesthesia with the patient or authorized representative who has indicated his/her understanding and acceptance.     Dental advisory given  Plan Discussed with: CRNA  Anesthesia Plan Comments:         Anesthesia Quick Evaluation

## 2018-06-28 NOTE — H&P (Signed)
ORTHOPAEDIC H and P  REQUESTING PHYSICIAN: Yolonda Kida, MD  PCP:  Patient, No Pcp Per  Chief Complaint: Left humerus fracture  HPI: Becky Townsend is a 48 y.o. female who complains of persistent pain and dysfunction of the left arm following a humeral shaft comminuted fracture last year.  She opted for nonoperative management on the onset.  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  She presents today for open reduction and internal fixation.  She has no new complaints at this time.  She denies any new pains in fact has had some reduction in pain but continues to have a clicking and catching in the humerus.  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  Past Medical History:  Diagnosis Date  . Anxiety   . History of kidney stones   . Medical history non-contributory   . PONV (postoperative nausea and vomiting)    even though no surgeries is very prone to nausea and vomiting with many pain medications in the past           Past Surgical History:  Procedure Laterality Date  . NO PAST SURGERIES     Social History   Socioeconomic History  . Marital status: Married    Spouse name: Not on file  . Number of children: Not on file  . Years of education: Not on file  . Highest education level: Not on file  Occupational History  . Not on file  Social Needs  . Financial resource strain: Not on file  . Food insecurity:    Worry: Not on file    Inability: Not on file  . Transportation needs:    Medical: Not on file    Non-medical: Not on file  Tobacco Use  . Smoking status: Never Smoker  . Smokeless tobacco: Never Used  Substance and Sexual Activity  . Alcohol use: Never    Frequency: Never  . Drug use: Never  . Sexual activity: Not on file  Lifestyle  . Physical activity:    Days per week: Not on file    Minutes per session: Not on file  . Stress: Not on file  Relationships  . Social connections:    Talks on phone: Not on file    Gets together: Not  on file    Attends religious service: Not on file    Active member of club or organization: Not on file    Attends meetings of clubs or organizations: Not on file    Relationship status: Not on file  Other Topics Concern  . Not on file  Social History Narrative  . Not on file   Family History  Problem Relation Age of Onset  . Hypertension Father    Allergies  Allergen Reactions  . Percocet [Oxycodone-Acetaminophen] Nausea And Vomiting   Prior to Admission medications   Medication Sig Start Date End Date Taking? Authorizing Provider  Calcium Carb-Cholecalciferol (CALCIUM + VITAMIN D3 PO) Take 1 tablet by mouth 2 (two) times daily.   Yes [provider]  ibuprofen (ADVIL,MOTRIN) 200 MG tablet Take 400 mg by mouth every 6 (six) hours as needed for headache or moderate pain.    Yes [provider]   No results found.  Positive ROS: All other systems have been reviewed and were otherwise negative with the exception of those mentioned in the HPI and as above.  Physical Exam: General: Alert, no acute distress Cardiovascular: No pedal edema Respiratory: No cyanosis, no use of  accessory musculature GI: No organomegaly, abdomen is soft and non-tender Skin: No lesions in the area of chief complaint Neurologic: Sensation intact distally Psychiatric: Patient is competent for consent with normal mood and affect Lymphatic: No axillary or cervical lymphadenopathy    Assessment: Left humeral shaft nonunion  Plan: -Our plan today will be for open reduction and internal fixation with compression plating of the humeral shaft.  We again reviewed the risk, benefits, and indications of this procedure at length.  All questions were solicited and answered to her satisfaction.  She would desire to go home postoperatively.  We will monitor her closely intraoperatively and in PACU.  I think if she has adequate pain control and minimal blood loss that would be a reasonable  undertaking.  Otherwise we will keep her overnight for observation and discharge home tomorrow. -She will be nonweightbearing to the left upper extremity following surgery and will be in a sling for comfort.  We will see her back in the office in 2 weeks.    Yolonda Kida, MD Cell [phone]    06/28/2018 12:05 PM

## 2018-06-28 NOTE — Brief Op Note (Signed)
06/28/2018  3:18 PM  PATIENT:  Becky Townsend  47 y.o. female  PRE-OPERATIVE DIAGNOSIS:  Left humerus fracture, nonuinon  POST-OPERATIVE DIAGNOSIS:  Left humerus fracture, nonunion  PROCEDURE:  Procedure(s) with comments: OPEN REDUCTION INTERNAL FIXATION (ORIF) HUMERUS FRACTURE (Left) - 120 mins  SURGEON:  Surgeon(s) and Role:    * Aundria Rud, Noah Delaine, MD - Primary  PHYSICIAN ASSISTANT:   ASSISTANTS: Patrick Jupiter, RNFA   ANESTHESIA:   general with interscalene block  EBL:  100 mL   BLOOD ADMINISTERED:none  DRAINS: none   LOCAL MEDICATIONS USED:  MARCAINE     SPECIMEN:  No Specimen  DISPOSITION OF SPECIMEN:  N/A  COUNTS:  YES  TOURNIQUET:  * No tourniquets in log *  DICTATION: .Note written in EPIC  PLAN OF CARE: Discharge to home after PACU  PATIENT DISPOSITION:  PACU - hemodynamically stable.   Delay start of Pharmacological VTE agent (>24hrs) due to surgical blood loss or risk of bleeding: not applicable

## 2018-06-28 NOTE — Transfer of Care (Signed)
Immediate Anesthesia Transfer of Care Note  Patient: Becky Townsend  Procedure(s) Performed: OPEN REDUCTION INTERNAL FIXATION (ORIF) HUMERUS FRACTURE (Left )  Patient Location: PACU  Anesthesia Type:General and Regional  Level of Consciousness: awake, alert  and oriented  Airway & Oxygen Therapy: Patient Spontanous Breathing and Patient connected to nasal cannula oxygen  Post-op Assessment: Report given to RN and Post -op Vital signs reviewed and stable  Post vital signs: Reviewed and stable  Last Vitals:  Vitals Value Taken Time  BP 150/89 06/28/2018  3:10 PM  Temp    Pulse 85 06/28/2018  3:11 PM  Resp 21 06/28/2018  3:11 PM  SpO2 85 % 06/28/2018  3:11 PM  Vitals shown include unvalidated device data.  Last Pain:  Vitals:   06/28/18 1107  TempSrc:   PainSc: 0-No pain      Patients Stated Pain Goal: 2 (06/28/18 1107)  Complications: No apparent anesthesia complications

## 2018-06-28 NOTE — Anesthesia Procedure Notes (Signed)
Anesthesia Regional Block: Interscalene brachial plexus block   Pre-Anesthetic Checklist: ,, timeout performed, Correct Patient, Correct Site, Correct Laterality, Correct Procedure, Correct Position, site marked, Risks and benefits discussed, pre-op evaluation,  At surgeon's request and post-op pain management  Laterality: Left  Prep: Maximum Sterile Barrier Precautions used, chloraprep       Needles:  Injection technique: Single-shot  Needle Type: Echogenic Stimulator Needle     Needle Length: 5cm  Needle Gauge: 22     Additional Needles:   Procedures:, nerve stimulator,,, ultrasound used (permanent image in chart),,,,   Nerve Stimulator or Paresthesia:  Response: Biceps response,   Additional Responses:   Narrative:  Start time: 06/28/2018 12:05 PM End time: 06/28/2018 12:15 PM Injection made incrementally with aspirations every 5 mL. Anesthesiologist: Gaynelle Adu, MD  Additional Notes: 2% Lidocaine skin wheel.

## 2018-06-28 NOTE — Progress Notes (Addendum)
Assisted Dr. Aleene Davidson with left interscalene block. Side rails up, monitors on throughout procedure. See vital signs in flow sheet. Tolerated Procedure well.

## 2018-06-29 ENCOUNTER — Encounter (HOSPITAL_COMMUNITY): Payer: Self-pay | Admitting: Orthopedic Surgery

## 2018-06-29 DIAGNOSIS — S42302K Unspecified fracture of shaft of humerus, left arm, subsequent encounter for fracture with nonunion: Secondary | ICD-10-CM | POA: Diagnosis not present

## 2018-06-29 LAB — CBC
HCT: 34.8 % — ABNORMAL LOW (ref 36.0–46.0)
Hemoglobin: 12.5 g/dL (ref 12.0–15.0)
MCH: 30 pg (ref 26.0–34.0)
MCHC: 35.9 g/dL (ref 30.0–36.0)
MCV: 83.7 fL (ref 80.0–100.0)
Platelets: 194 10*3/uL (ref 150–400)
RBC: 4.16 MIL/uL (ref 3.87–5.11)
RDW: 13.9 % (ref 11.5–15.5)
WBC: 17.3 10*3/uL — ABNORMAL HIGH (ref 4.0–10.5)
nRBC: 0 % (ref 0.0–0.2)

## 2018-06-29 LAB — HIV ANTIBODY (ROUTINE TESTING W REFLEX): HIV Screen 4th Generation wRfx: NONREACTIVE

## 2018-06-29 MED ORDER — OXYCODONE HCL 5 MG PO TABS: 5.0000 mg | ORAL_TABLET | ORAL | 0 refills | Status: DC | PRN

## 2018-06-29 MED ORDER — ONDANSETRON 4 MG PO TBDP: 4.0000 mg | ORAL_TABLET | Freq: Three times a day (TID) | ORAL | 0 refills | Status: DC | PRN

## 2018-06-29 NOTE — Discharge Instructions (Signed)
Orthopedic discharge instructions: -Sling is for comfort only.  You may remove this at your leisure when the block resolves.  You should reapply the sling for comfort and when sleeping for the first 2 weeks. -No lifting over 2 pounds for the left arm -Maintain postoperative bandage until your follow-up appointment in 2 weeks.  If this become saturated or soiled you should remove and replace with daily dry dressings.  You may shower with this bandage in place but do not submerge underwater. -For pain apply ice to the arm for 20 to 30 minutes at a time.  You may also use Tylenol and Advil as well as oxycodone as directed. -Return to see Dr. Aundria Rud in 2 weeks for routine follow-up.

## 2018-06-29 NOTE — Plan of Care (Signed)
  Problem: Pain Managment: Goal: General experience of comfort will improve Outcome: Progressing   Problem: Safety: Goal: Ability to remain free from injury will improve Outcome: Progressing   

## 2018-06-29 NOTE — Progress Notes (Signed)
   Subjective:  Patient reports pain as mild.  Denies any pain at this time.  She has dense block still intact proximally.  At the hand and wrist she states that she can feel her fingers.  She denies nausea or vomiting.  She has had a bowel movement.  Denies chest pain or shortness of breath.  Objective:   VITALS:   Vitals:   06/28/18 1658 06/28/18 2031 06/28/18 2340 06/29/18 0519  BP: (!) 129/98 (!) 149/100 (!) 136/98 131/81  Pulse: 89 100 (!) 109 (!) 103  Resp: 16 19 18    Temp: (!) 95.5 F (35.3 C) 99.5 F (37.5 C) 98.4 F (36.9 C) (!) 96.4 F (35.8 C)  TempSrc: Oral Oral Oral Oral  SpO2:  94% 92%   Weight:      Height:        Sensation intact distally Intact pulses distally Incision: dressing C/D/I Compartment soft Approximately decrease in station in the axillary nerve and musculocutaneous nerve.  She has motor intact at the hand and wrist but not at the elbow or shoulder.   Lab Results  Component Value Date   WBC 17.3 (H) 06/29/2018   HGB 12.5 06/29/2018   HCT 34.8 (L) 06/29/2018   MCV 83.7 06/29/2018   PLT 194 06/29/2018   BMET No results found for: NA, K, CL, CO2, GLUCOSE, BUN, CREATININE, CALCIUM, GFRNONAA, GFRAA   Assessment/Plan: 1 Day Post-Op   Active Problems:   Closed displaced comminuted fracture of shaft of left humerus with nonunion   Closed displaced segmental fracture of shaft of left humerus with nonunion   Advance diet -Nonweightbearing to left upper extremity.  She may remove sling once her block has worn off. -Stable labs this morning. -Pain is well controlled.  We will plan for discharge home today with follow-up with me in 2 weeks.   Yolonda Kida 06/29/2018, 12:28 PM   Maryan Rued, MD [phone]

## 2018-06-30 NOTE — Anesthesia Postprocedure Evaluation (Signed)
Anesthesia Post Note  Patient: Becky Townsend  Procedure(s) Performed: OPEN REDUCTION INTERNAL FIXATION (ORIF) HUMERUS FRACTURE (Left )     Patient location during evaluation: PACU Anesthesia Type: General Level of consciousness: awake and alert Pain management: pain level controlled Vital Signs Assessment: post-procedure vital signs reviewed and stable Respiratory status: spontaneous breathing, nonlabored ventilation and respiratory function stable Cardiovascular status: blood pressure returned to baseline and stable Postop Assessment: no apparent nausea or vomiting Anesthetic complications: no    Last Vitals:  Vitals:   06/28/18 2340 06/29/18 0519  BP: (!) 136/98 131/81  Pulse: (!) 109 (!) 103  Resp: 18   Temp: 36.9 C (!) 35.8 C  SpO2: 92%     Last Pain:  Vitals:   06/29/18 0745  TempSrc:   PainSc: 0-No pain                 Cecile HearingStephen Edward Turk

## 2018-07-01 NOTE — Discharge Summary (Signed)
Patient ID: Becky Townsend MRN: [id number] DOB/AGE: 50-Jun-1972 50 y.o.  Admit date: 06/28/2018 Discharge date: 06/29/2018  Primary Diagnosis: Left humeral shaft nonunion Admission Diagnoses:  Past Medical History:  Diagnosis Date  . Anxiety   . History of kidney stones   . Medical history non-contributory   . PONV (postoperative nausea and vomiting)    even though no surgeries is very prone to nausea and vomiting with many pain medications in the past           Discharge Diagnoses:   Active Problems:   Closed displaced comminuted fracture of shaft of left humerus with nonunion   Closed displaced segmental fracture of shaft of left humerus with nonunion  Estimated body mass index is 42.51 kg/m as calculated from the following:   Height as of this encounter: 5\' 1"  (1.549 m).   Weight as of this encounter: 102.1 kg.  Procedure:  Procedure(s) (LRB): OPEN REDUCTION INTERNAL FIXATION (ORIF) HUMERUS FRACTURE (Left)   Consults: None  HPI: Becky FanningJulie presents to the hospital for open reduction and internal fixation of her left humeral shaft nonunion.  This was related to a work-related injury about 9 months ago.  No new complaints. Laboratory Data: Admission on 06/28/2018, Discharged on 06/29/2018  Component Date Value Ref Range Status  . Preg Test, Ur 06/28/2018 NEGATIVE  NEGATIVE Final   Comment:        THE SENSITIVITY OF THIS METHODOLOGY IS >24 mIU/mL   . HIV Screen 4th Generation wRfx 06/29/2018 Non Reactive  Non Reactive Final   Comment: (NOTE) Performed At: [facility] [address] [place], [place] [id number] Jolene SchimkeNagendra Sanjai MD ZH:[id number]:[phone]   . WBC 06/29/2018 17.3* 4.0 - 10.5 K/uL Final  . RBC 06/29/2018 4.16  3.87 - 5.11 MIL/uL Final  . Hemoglobin 06/29/2018 12.5  12.0 - 15.0 g/dL Final  . HCT 29/52/841302/09/2018 34.8* 36.0 - 46.0 % Final  . MCV 06/29/2018 83.7  80.0 - 100.0 fL Final  . MCH 06/29/2018 30.0  26.0 - 34.0 pg Final  . MCHC 06/29/2018 35.9  30.0 - 36.0 g/dL  Final  . RDW 24/40/102702/09/2018 13.9  11.5 - 15.5 % Final  . Platelets 06/29/2018 194  150 - 400 K/uL Final  . nRBC 06/29/2018 0.0  0.0 - 0.2 % Final   Performed at [facility], [address] [place], [place] [address]  Hospital Outpatient Visit on 06/21/2018  Component Date Value Ref Range Status  . WBC 06/21/2018 8.2  4.0 - 10.5 K/uL Final  . RBC 06/21/2018 4.77  3.87 - 5.11 MIL/uL Final  . Hemoglobin 06/21/2018 13.9  12.0 - 15.0 g/dL Final  . HCT 44/03/474201/28/2020 41.6  36.0 - 46.0 % Final  . MCV 06/21/2018 87.2  80.0 - 100.0 fL Final  . MCH 06/21/2018 29.1  26.0 - 34.0 pg Final  . MCHC 06/21/2018 33.4  30.0 - 36.0 g/dL Final  . RDW 59/56/387501/28/2020 13.7  11.5 - 15.5 % Final  . Platelets 06/21/2018 235  150 - 400 K/uL Final  . nRBC 06/21/2018 0.0  0.0 - 0.2 % Final   Performed at [facility], [address] [place], [place] [address]     X-Rays:Dg Humerus Left  Result Date: 06/28/2018 CLINICAL DATA:  Humerus fracture ORIF. EXAM: LEFT HUMERUS - 2+ VIEW; DG C-ARM 61-120 MIN COMPARISON:  Left shoulder x-rays dated August 30, 2017. FLUOROSCOPY TIME:  48 seconds. C-arm fluoroscopic images were obtained intraoperatively and submitted for post operative interpretation. FINDINGS:  Multiple intraoperative fluoroscopic images demonstrate interval lateral plate and screw fixation of the left proximal to mid humeral diaphyseal fracture. Alignment is now anatomic. No hardware complication. IMPRESSION: Intraoperative fluoroscopic guidance for left humerus ORIF. Electronically Signed   By: Obie DredgeWilliam T Derry M.D.   On: 06/28/2018 14:56   Dg C-arm 1-60 Min  Result Date: 06/28/2018 CLINICAL DATA:  Humerus fracture ORIF. EXAM: LEFT HUMERUS - 2+ VIEW; DG C-ARM 61-120 MIN COMPARISON:  Left shoulder x-rays dated August 30, 2017. FLUOROSCOPY TIME:  48 seconds. C-arm fluoroscopic images were obtained intraoperatively and submitted for post operative interpretation. FINDINGS: Multiple intraoperative fluoroscopic images  demonstrate interval lateral plate and screw fixation of the left proximal to mid humeral diaphyseal fracture. Alignment is now anatomic. No hardware complication. IMPRESSION: Intraoperative fluoroscopic guidance for left humerus ORIF. Electronically Signed   By: Obie DredgeWilliam T Derry M.D.   On: 06/28/2018 14:56    EKG:No orders found for this or any previous visit.   Hospital Course: Becky Townsend is a 48 y.o. who was admitted to Hospital. They were brought to the operating room on 06/28/2018 and underwent Procedure(s): OPEN REDUCTION INTERNAL FIXATION (ORIF) HUMERUS FRACTURE.  Patient tolerated the procedure well and was later transferred to the recovery room and then to the orthopaedic floor for postoperative care.  They were given PO and IV analgesics for pain control following their surgery.  They were given 24 hours of postoperative antibiotics of  Anti-infectives (From admission, onward)   Start     Dose/Rate Route Frequency Ordered Stop   06/28/18 0930  ceFAZolin (ANCEF) IVPB 2g/100 mL premix     2 g 200 mL/hr over 30 Minutes Intravenous On call to O.R. 06/28/18 [id number] 06/28/18 1248     and started on DVT prophylaxis in the form of Aspirin.   The peripheral interscalene nerve block provided adequate pain control overnight.  She felt well enough to return home postoperative day #1.  No overnight issues.  Incision was healing well.  Patient was seen in rounds and was ready to go home.   Diet: Regular diet Activity:NWB Follow-up:in 2 weeks Disposition - Home Discharged Condition: good   Discharge Instructions    Call MD / Call 911   Complete by:  As directed    If you experience chest pain or shortness of breath, CALL 911 and be transported to the hospital emergency room.  If you develope a fever above 101 F, pus (white drainage) or increased drainage or redness at the wound, or calf pain, call your surgeon's office.   Constipation Prevention   Complete by:  As directed    Drink plenty of  fluids.  Prune juice may be helpful.  You may use a stool softener, such as Colace (over the counter) 100 mg twice a day.  Use MiraLax (over the counter) for constipation as needed.   Diet - low sodium heart healthy   Complete by:  As directed    Increase activity slowly as tolerated   Complete by:  As directed      Allergies as of 06/29/2018      Reactions   Percocet [oxycodone-acetaminophen] Nausea And Vomiting      Medication List    TAKE these medications   CALCIUM + VITAMIN D3 PO Take 1 tablet by mouth 2 (two) times daily.   ibuprofen 200 MG tablet Commonly known as:  ADVIL,MOTRIN Take 400 mg by mouth every 6 (six) hours as needed for headache or moderate pain.   ondansetron 4  MG disintegrating tablet Commonly known as:  ZOFRAN ODT Take 1 tablet (4 mg total) by mouth every 8 (eight) hours as needed.   oxyCODONE 5 MG immediate release tablet Commonly known as:  Oxy IR/ROXICODONE Take 1 tablet (5 mg total) by mouth every 4 (four) hours as needed for moderate pain, severe pain or breakthrough pain.      Follow-up Information    Yolonda Kida, MD In 2 weeks.   Specialty:  Orthopedic Surgery Why:  For suture removal, For wound re-check Contact information: [address] [place] [address] [place] [address] [phone]           Signed: Maryan Rued, MD Orthopaedic Surgery 07/01/2018, 1:46 PM

## 2019-03-29 IMAGING — RF DG C-ARM 61-120 MIN
1 series · 4 of 4 positions shown · non-contrast
Comparison: Left shoulder x-rays dated August 30, 2017.

CLINICAL DATA: Humerus fracture ORIF.

EXAM:
LEFT HUMERUS - 2+ VIEW; DG C-ARM 61-120 MIN

[Series 1: run · 4 of 4 slices shown]
[im 1/4]
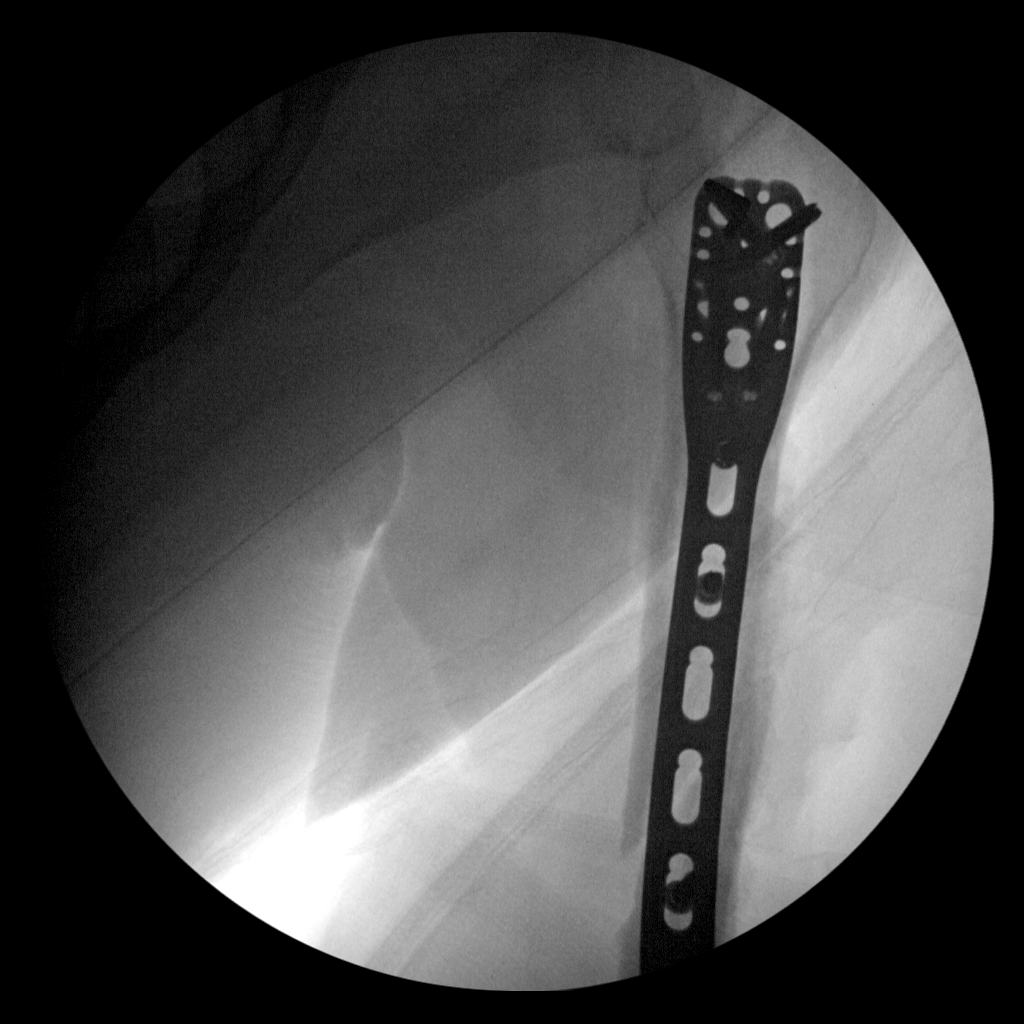
[im 2/4]
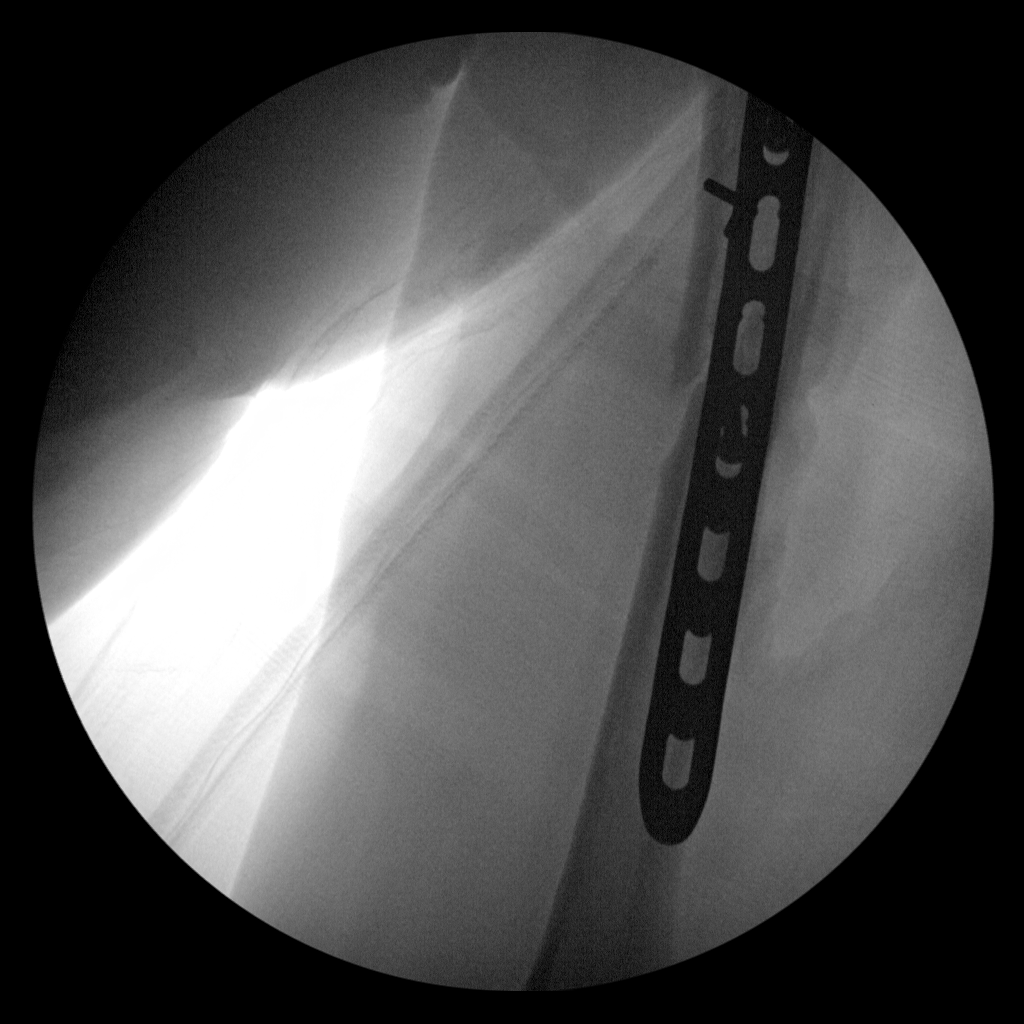
[im 3/4]
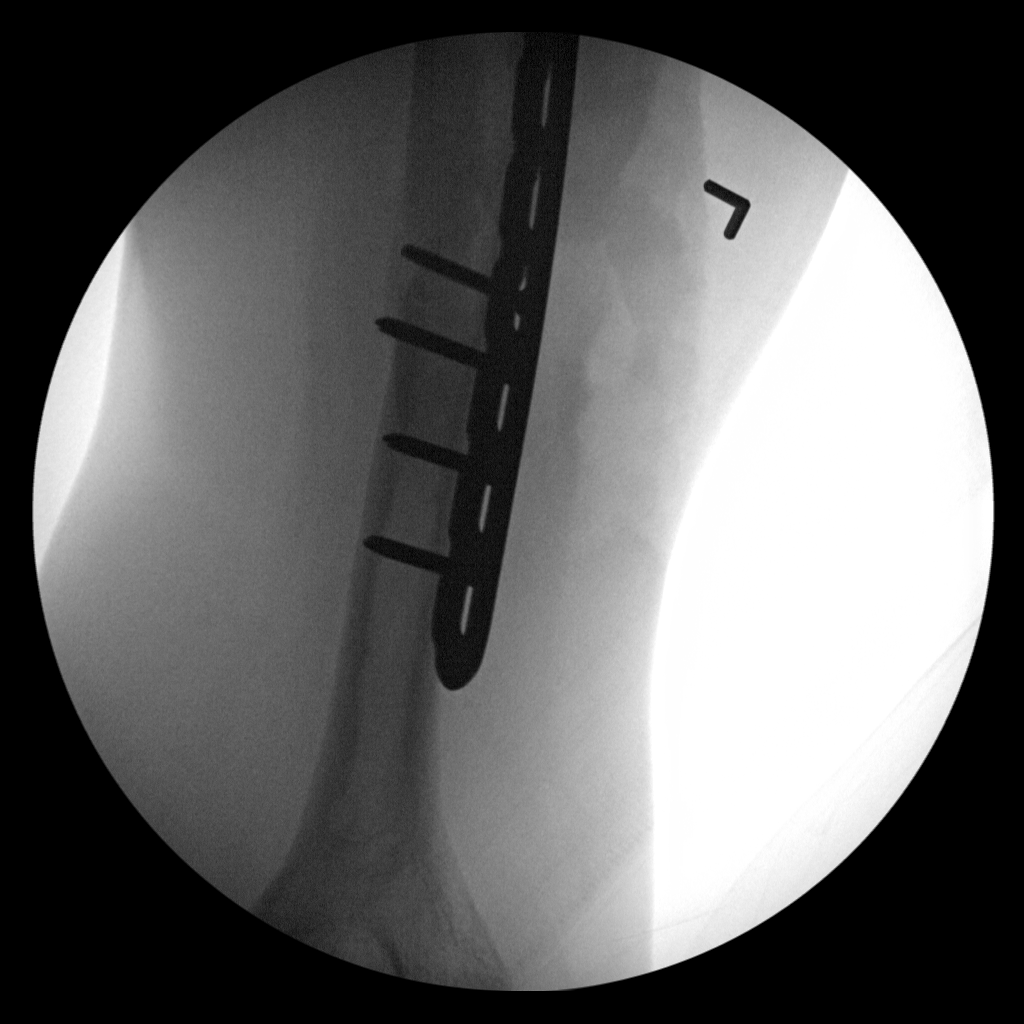
[im 4/4]
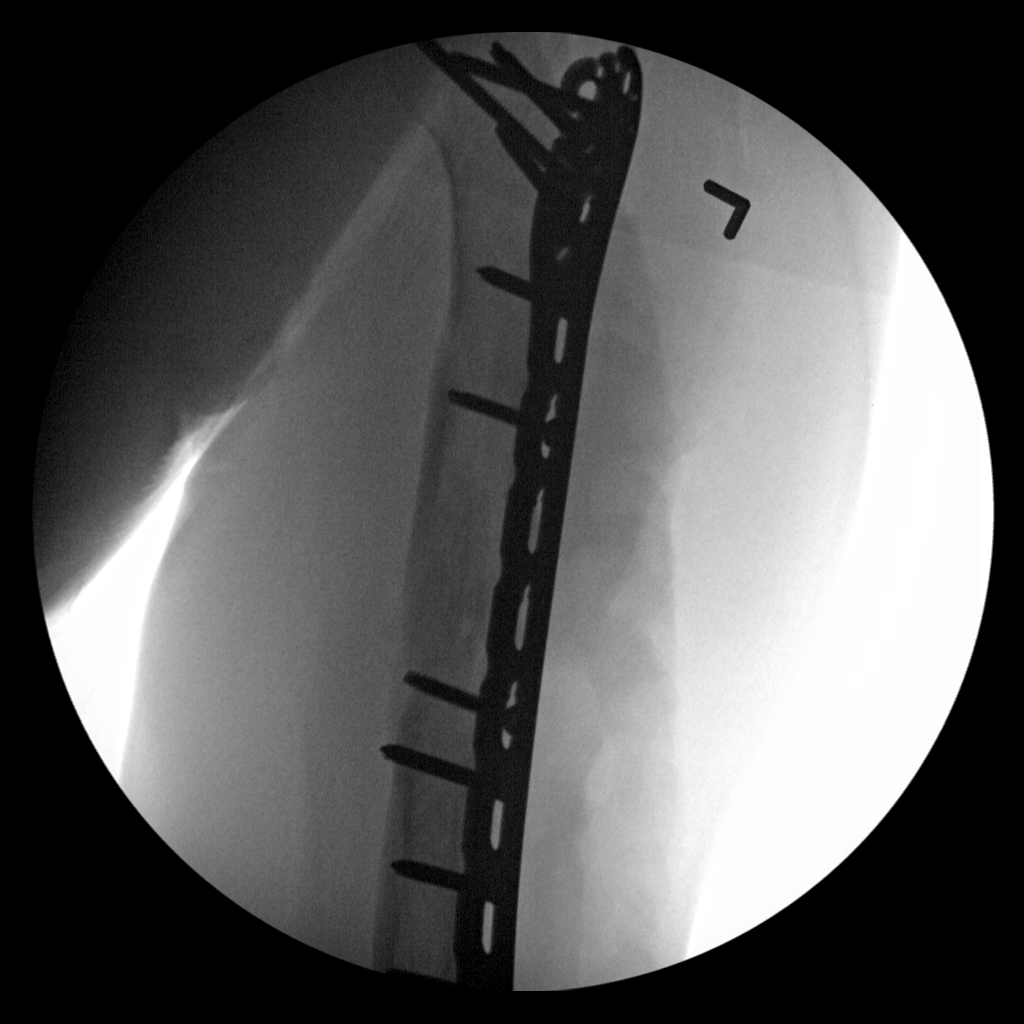

[4 of 4 positions shown; findings below may reference images not displayed]

FLUOROSCOPY TIME:  48 seconds.

C-arm fluoroscopic images were obtained intraoperatively and
submitted for post operative interpretation.
FINDINGS: Multiple intraoperative fluoroscopic images demonstrate interval
lateral plate and screw fixation of the left proximal to mid humeral
diaphyseal fracture. Alignment is now anatomic. No hardware
complication.
IMPRESSION: Intraoperative fluoroscopic guidance for left humerus ORIF.

## 2021-07-21 ENCOUNTER — Emergency Department (HOSPITAL_BASED_OUTPATIENT_CLINIC_OR_DEPARTMENT_OTHER): Payer: No Typology Code available for payment source

## 2021-07-21 ENCOUNTER — Encounter (HOSPITAL_BASED_OUTPATIENT_CLINIC_OR_DEPARTMENT_OTHER): Payer: Self-pay | Admitting: Obstetrics and Gynecology

## 2021-07-21 ENCOUNTER — Other Ambulatory Visit (HOSPITAL_BASED_OUTPATIENT_CLINIC_OR_DEPARTMENT_OTHER): Payer: Self-pay

## 2021-07-21 ENCOUNTER — Other Ambulatory Visit: Payer: Self-pay

## 2021-07-21 ENCOUNTER — Emergency Department (HOSPITAL_BASED_OUTPATIENT_CLINIC_OR_DEPARTMENT_OTHER)
Admission: EM | Admit: 2021-07-21 | Discharge: 2021-07-21 | Disposition: A | Discharge status: Home / Self Care | Payer: No Typology Code available for payment source | Attending: Student | Admitting: Student

## 2021-07-21 DIAGNOSIS — R748 Abnormal levels of other serum enzymes: Secondary | ICD-10-CM | POA: Insufficient documentation

## 2021-07-21 DIAGNOSIS — R739 Hyperglycemia, unspecified: Secondary | ICD-10-CM | POA: Diagnosis not present

## 2021-07-21 DIAGNOSIS — N390 Urinary tract infection, site not specified: Secondary | ICD-10-CM | POA: Insufficient documentation

## 2021-07-21 DIAGNOSIS — R109 Unspecified abdominal pain: Secondary | ICD-10-CM | POA: Diagnosis present

## 2021-07-21 LAB — URINALYSIS, ROUTINE W REFLEX MICROSCOPIC
Bilirubin Urine: NEGATIVE
Glucose, UA: >1000 mg/dL — AB
Ketones, ur: NEGATIVE mg/dL
Nitrite: POSITIVE — AB
Specific Gravity, Urine: 1.028 (ref 1.005–1.030)
pH: 5.5 (ref 5.0–8.0)

## 2021-07-21 LAB — HEMOGLOBIN A1C
Hgb A1c MFr Bld: 10.9 % — ABNORMAL HIGH (ref 4.8–5.6)
Mean Plasma Glucose: 266.13 mg/dL

## 2021-07-21 LAB — COMPREHENSIVE METABOLIC PANEL
ALT: 12 U/L (ref 0–44)
AST: 11 U/L — ABNORMAL LOW (ref 15–41)
Albumin: 4 g/dL (ref 3.5–5.0)
Alkaline Phosphatase: 80 U/L (ref 38–126)
Anion gap: 9 (ref 5–15)
BUN: 21 mg/dL — ABNORMAL HIGH (ref 6–20)
CO2: 26 mmol/L (ref 22–32)
Calcium: 9.5 mg/dL (ref 8.9–10.3)
Chloride: 100 mmol/L (ref 98–111)
Creatinine, Ser: 0.78 mg/dL (ref 0.44–1.00)
GFR, Estimated: >60 mL/min (ref >60)
Glucose, Bld: 399 mg/dL — ABNORMAL HIGH (ref 70–99)
Potassium: 4.1 mmol/L (ref 3.5–5.1)
Sodium: 135 mmol/L (ref 135–145)
Total Bilirubin: 0.8 mg/dL (ref 0.3–1.2)
Total Protein: 7.2 g/dL (ref 6.5–8.1)

## 2021-07-21 LAB — CBC
HCT: 40.6 % (ref 36.0–46.0)
Hemoglobin: 13.8 g/dL (ref 12.0–15.0)
MCH: 28.7 pg (ref 26.0–34.0)
MCHC: 34 g/dL (ref 30.0–36.0)
MCV: 84.4 fL (ref 80.0–100.0)
Platelets: 233 10*3/uL (ref 150–400)
RBC: 4.81 MIL/uL (ref 3.87–5.11)
RDW: 13.3 % (ref 11.5–15.5)
WBC: 8.5 10*3/uL (ref 4.0–10.5)
nRBC: 0 % (ref 0.0–0.2)

## 2021-07-21 LAB — LIPASE, BLOOD: Lipase: 55 U/L — ABNORMAL HIGH (ref 11–51)

## 2021-07-21 LAB — PREGNANCY, URINE: Preg Test, Ur: NEGATIVE

## 2021-07-21 MED ORDER — IOHEXOL 300 MG/ML  SOLN
100.0000 mL | Freq: Once | INTRAMUSCULAR | Status: AC | PRN
  Administered 2021-07-21: 85 mL via INTRAVENOUS

## 2021-07-21 MED ORDER — CEFADROXIL 500 MG PO CAPS
500.0000 mg | ORAL_CAPSULE | Freq: Two times a day (BID) | ORAL | 0 refills | Status: AC
  Filled 2021-07-21: qty 14, 7d supply, fill #0

## 2021-07-21 MED ORDER — CEPHALEXIN 250 MG PO CAPS
500.0000 mg | ORAL_CAPSULE | Freq: Once | ORAL | Status: AC
  Administered 2021-07-21: 500 mg via ORAL
  Filled 2021-07-21: qty 2

## 2021-07-21 NOTE — ED Notes (Signed)
Waiting on pharmacy to bring discharge medications down.

## 2021-07-21 NOTE — ED Provider Notes (Signed)
[facility] EMERGENCY DEPT Provider Note  CSN: [id number] Arrival date & time: 07/21/21 1023  Chief Complaint(s) Abdominal Pain and Hip Pain  HPI CAYLEEN BENJAMIN is a 51 y.o. female who presents emergency department for evaluation of right flank, back and right inguinal pain.  Patient states that for the last 48 hours she has had increasing intermittent pain that started in the back and flank and now is intermittently in the perineum on the right.  Patient states pain is worse when lying flat.  Denies chest pain, shortness of breath, abdominal pain, nausea, vomiting or other systemic symptoms.   Abdominal Pain Hip Pain Associated symptoms include abdominal pain.   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 Authorizing Provider  Calcium Carb-Cholecalciferol (CALCIUM + VITAMIN D3 PO) Take 1 tablet by mouth 2 (two) times daily.    [provider]  ibuprofen (ADVIL,MOTRIN) 200 MG tablet Take 400 mg by mouth every 6 (six) hours as needed for headache or moderate pain.     [provider]  ondansetron (ZOFRAN ODT) 4 MG disintegrating tablet Take 1 tablet (4 mg total) by mouth every 8 (eight) hours as needed. 06/29/18   Yolonda Kida, MD  oxyCODONE (OXY IR/ROXICODONE) 5 MG immediate release tablet Take 1 tablet (5 mg total) by mouth every 4 (four) hours as needed for moderate pain, severe  pain or breakthrough pain. 06/29/18   Yolonda Kida, MD                                                                                                                                    Past Surgical History Past Surgical History:  Procedure Laterality Date   NO PAST SURGERIES     ORIF HUMERUS FRACTURE Left 06/28/2018   Procedure: OPEN REDUCTION INTERNAL FIXATION (ORIF) HUMERUS FRACTURE;  Surgeon: Yolonda Kida, MD;  Location: [facility] OR;  Service: Orthopedics;  Laterality: Left;  120 mins   Family History Family History  Problem Relation Age of Onset   Hypertension Father     Social History Social History   Tobacco Use   Smoking status: Never   Smokeless tobacco: Never  Vaping Use   Vaping Use: Never used  Substance Use Topics   Alcohol use: Never   Drug use:  Never   Allergies Percocet [oxycodone-acetaminophen] and Sudafed [pseudoephedrine]  Review of Systems Review of Systems  Gastrointestinal:  Positive for abdominal pain.   Physical Exam Vital Signs  I have reviewed the triage vital signs BP (!) 164/89    Pulse 88    Temp 98.3 F (36.8 C)    Resp 16    Ht 5\' 1"  (1.549 m)    Wt 96.5 kg    SpO2 100%    BMI 40.20 kg/m   Physical Exam Vitals and nursing note reviewed.  Constitutional:      General: She is not in acute distress.    Appearance: She is well-developed.  HENT:     Head: Normocephalic and atraumatic.  Eyes:     Conjunctiva/sclera: Conjunctivae normal.  Cardiovascular:     Rate and Rhythm: Normal rate and regular rhythm.     Heart sounds: No murmur heard. Pulmonary:     Effort: Pulmonary effort is normal. No respiratory distress.     Breath sounds: Normal breath sounds.  Abdominal:     Palpations: Abdomen is soft.     Tenderness: There is no abdominal tenderness.  Musculoskeletal:        General: No swelling.     Cervical back: Neck supple.  Skin:    General: Skin is warm and dry.     Capillary Refill: Capillary refill takes less than  2 seconds.  Neurological:     Mental Status: She is alert.  Psychiatric:        Mood and Affect: Mood normal.    ED Results and Treatments Labs (all labs ordered are listed, but only abnormal results are displayed) Labs Reviewed  URINALYSIS, ROUTINE W REFLEX MICROSCOPIC - Abnormal; Notable for the following components:      Result Value   Glucose, UA >1,000 (*)    Hgb urine dipstick TRACE (*)    Protein, ur TRACE (*)    Nitrite POSITIVE (*)    Leukocytes,Ua TRACE (*)    Bacteria, UA MANY (*)    All other components within normal limits  CBC  PREGNANCY, URINE  LIPASE, BLOOD  COMPREHENSIVE METABOLIC PANEL                                                                                                                          Radiology No results found.  Pertinent labs & imaging results that were available during my care of the patient were reviewed by me and considered in my medical decision making (see MDM for details).  Medications Ordered in ED Medications - No data to display  Procedures Procedures  (including critical care time)  Medical Decision Making / ED Course   This patient presents to the ED for concern of right flank and inguinal pain, this involves an extensive number of treatment options, and is a complaint that carries with it a high risk of complications and morbidity.  The differential diagnosis includes UTI, pyelonephritis, stone, hernia  MDM: Patient seen in the emergency department for evaluation of right flank and inguinal pain.  Physical exam largely unremarkable with some very mild pain over the buttock on the right and in the CVA on the right.  Laboratory evaluation with a mild lipase elevation to 55 but patient with no evidence or signs and symptoms of pancreatitis.  CBC unremarkable.  Chemistry with a elevated glucose to  399 but no anion gap and normal CO2, no evidence of DKA here in the emergency department.  Urinalysis concerning for urinary tract infection.  CT abdomen pelvis obtained to rule out pyelonephritis which was reassuringly negative.  I had a long discussion with the patient about her hyperglycemia and as it is asymptomatic here will not aggressively treat with insulin but instead, using shared decision making, we decided that patient will establish care with a primary care physician and we will send an A1c today to assist this future physician.  She was given her first dose of Keflex here and will be discharged with [facility] for her cystitis.   Additional history obtained: -Additional history obtained from husband  -External records from outside source obtained and reviewed including: Chart review including previous notes, labs, imaging, consultation notes   Lab Tests: -I ordered, reviewed, and interpreted labs.   The pertinent results include:   Labs Reviewed  URINALYSIS, ROUTINE W REFLEX MICROSCOPIC - Abnormal; Notable for the following components:      Result Value   Glucose, UA >1,000 (*)    Hgb urine dipstick TRACE (*)    Protein, ur TRACE (*)    Nitrite POSITIVE (*)    Leukocytes,Ua TRACE (*)    Bacteria, UA MANY (*)    All other components within normal limits  CBC  PREGNANCY, URINE  LIPASE, BLOOD  COMPREHENSIVE METABOLIC PANEL     Imaging Studies ordered: I ordered imaging studies including CTAP I independently visualized and interpreted imaging. I agree with the radiologist interpretation   Medicines ordered and prescription drug management: No orders of the defined types were placed in this encounter.   -I have reviewed the patients home medicines and have made adjustments as needed  Critical interventions none   Cardiac Monitoring: The patient was maintained on a cardiac monitor.  I personally viewed and interpreted the cardiac monitored which showed an underlying  rhythm of: NSR  Social Determinants of Health:  Factors impacting patients care include: none   Reevaluation: After the interventions noted above, I reevaluated the patient and found that they have :improved  Co morbidities that complicate the patient evaluation  Past Medical History:  Diagnosis Date   Anxiety    History of kidney stones    Medical history non-contributory    PONV (postoperative nausea and vomiting)    even though no surgeries is very prone to nausea and vomiting with many pain medications in the past              Dispostion: I considered admission for this patient, but her diagnosis of a urinary tract infection and hyperglycemia can be managed outside the hospital and she will be discharged with outpatient antibiotics and  PCP follow-up     Final Clinical Impression(s) / ED Diagnoses Final diagnoses:  None     @PCDICTATION @    Kashay Cavenaugh, Wyn ForsterMadison, MD 07/21/21 1256

## 2021-07-21 NOTE — ED Notes (Signed)
Patient transported to CT 

## 2021-07-21 NOTE — ED Notes (Signed)
Previous RN provided AVS using Teachback Method. Patient verbalizes understanding of Discharge Instructions. Opportunity for Questioning and Answers were provided by RN. Patient Discharged from ED.

## 2021-07-21 NOTE — ED Triage Notes (Signed)
Patient reports to the ER for abdominal pain in the RLQ that may or may not be related to skin tenderness and pain across her right hip. Patient reports she has not had any N/V/D.

## 2022-03-17 ENCOUNTER — Telehealth: Payer: Self-pay | Admitting: Family

## 2022-03-17 NOTE — Telephone Encounter (Signed)
Okay by [employer] appt made

## 2022-04-06 ENCOUNTER — Encounter: Payer: Self-pay | Admitting: Family

## 2022-04-06 ENCOUNTER — Ambulatory Visit (INDEPENDENT_AMBULATORY_CARE_PROVIDER_SITE_OTHER): Payer: No Typology Code available for payment source

## 2022-04-06 ENCOUNTER — Ambulatory Visit (INDEPENDENT_AMBULATORY_CARE_PROVIDER_SITE_OTHER): Payer: No Typology Code available for payment source | Admitting: Family

## 2022-04-06 VITALS — BP 132/77 | HR 99 | Temp 97.0°F | Ht 61.0 in | Wt 168.6 lb

## 2022-04-06 DIAGNOSIS — Z0000 Encounter for general adult medical examination without abnormal findings: Secondary | ICD-10-CM

## 2022-04-06 DIAGNOSIS — M14671 Charcot's joint, right ankle and foot: Secondary | ICD-10-CM | POA: Diagnosis not present

## 2022-04-06 DIAGNOSIS — E11311 Type 2 diabetes mellitus with unspecified diabetic retinopathy with macular edema: Secondary | ICD-10-CM | POA: Diagnosis not present

## 2022-04-06 DIAGNOSIS — E1169 Type 2 diabetes mellitus with other specified complication: Secondary | ICD-10-CM

## 2022-04-06 DIAGNOSIS — M25471 Effusion, right ankle: Secondary | ICD-10-CM

## 2022-04-06 DIAGNOSIS — Z23 Encounter for immunization: Secondary | ICD-10-CM

## 2022-04-06 DIAGNOSIS — Z0001 Encounter for general adult medical examination with abnormal findings: Secondary | ICD-10-CM | POA: Diagnosis not present

## 2022-04-06 LAB — BAYER DCA HB A1C WAIVED: HB A1C (BAYER DCA - WAIVED): 6.6 % — ABNORMAL HIGH (ref 4.8–5.6)

## 2022-04-06 MED ORDER — ONETOUCH ULTRASOFT LANCETS MISC: 3 refills | Status: DC

## 2022-04-06 MED ORDER — BLOOD GLUCOSE METER KIT: PACK | 0 refills | Status: DC

## 2022-04-06 MED ORDER — ONETOUCH ULTRA 2 W/DEVICE KIT: PACK | 0 refills | Status: AC

## 2022-04-06 MED ORDER — BLOOD GLUCOSE METER KIT: PACK | 0 refills | Status: AC

## 2022-04-06 MED ORDER — ONETOUCH ULTRA VI STRP: ORAL_STRIP | 0 refills | Status: DC

## 2022-04-06 MED ORDER — ONETOUCH ULTRA 2 W/DEVICE KIT: PACK | 0 refills | Status: DC

## 2022-04-06 NOTE — Progress Notes (Addendum)
Subjective:    Patient ID: Becky Townsend, female    DOB: [birth year], 51 y.o.   MRN: [id number]  Chief Complaint  Patient presents with   New Patient (Initial Visit)   Pt presents to the office today to establish care. She is followed by ophthalmologists for retia swelling. She went to the ED on 07/21/21 and found to have a A1C of 10.9. She has not been on any medications. After leaving hospital, she went on a strict diet and lost 47 lbs.   She also reports right ankle swelling that started 6 months after a vacation from walking a lot. Reports it was improving, then she went on another vacation and did a lot of walking. Reports in the AM the swelling improves, but worsens as the day goes on.  Diabetes She presents for her initial diabetic visit. She has type 2 diabetes mellitus. Associated symptoms include blurred vision and foot paresthesias. Symptoms are stable. Diabetic complications include peripheral neuropathy. Risk factors for coronary artery disease include diabetes mellitus, hypertension, sedentary lifestyle and post-menopausal. She is following a generally healthy diet. (Does not check BS at home) Eye exam is current.      Review of Systems  Eyes:  Positive for blurred vision.  All other systems reviewed and are negative.   Family History  Problem Relation Age of Onset   Hypertension Father    Social History   Socioeconomic History   Marital status: Married    Spouse name: Not on file   Number of children: Not on file   Years of education: Not on file   Highest education level: Not on file  Occupational History   Not on file  Tobacco Use   Smoking status: Never   Smokeless tobacco: Never  Vaping Use   Vaping Use: Never used  Substance and Sexual Activity   Alcohol use: Yes    Comment: social only not often at all   Drug use: Never   Sexual activity: Yes    Birth control/protection: None  Other Topics Concern   Not on file  Social History Narrative   Not  on file   Social Determinants of Health   Financial Resource Strain: Not on file  Food Insecurity: Not on file  Transportation Needs: Not on file  Physical Activity: Not on file  Stress: Not on file  Social Connections: Not on file       Objective:   Physical Exam Vitals reviewed.  Constitutional:      General: She is not in acute distress.    Appearance: She is well-developed.  HENT:     Head: Normocephalic and atraumatic.     Right Ear: Tympanic membrane normal.     Left Ear: Tympanic membrane normal.  Eyes:     Pupils: Pupils are equal, round, and reactive to light.  Neck:     Thyroid: No thyromegaly.  Cardiovascular:     Rate and Rhythm: Normal rate and regular rhythm.     Heart sounds: Normal heart sounds. No murmur heard. Pulmonary:     Effort: Pulmonary effort is normal. No respiratory distress.     Breath sounds: Normal breath sounds. No wheezing.  Abdominal:     General: Bowel sounds are normal. There is no distension.     Palpations: Abdomen is soft.     Tenderness: There is no abdominal tenderness.  Musculoskeletal:        General: No tenderness. Normal range of motion.  Cervical back: Normal range of motion and neck supple.     Right lower leg: Edema (4+) present.  Skin:    General: Skin is warm and dry.  Neurological:     Mental Status: She is alert and oriented to person, place, and time.     Cranial Nerves: No cranial nerve deficit.     Deep Tendon Reflexes: Reflexes are normal and symmetric.  Psychiatric:        Behavior: Behavior normal.        Thought Content: Thought content normal.        Judgment: Judgment normal.         BP 132/77   Pulse 99   Temp (!) 97 F (36.1 C) (Temporal)   Ht _0  (1.549 m)   Wt 168 lb 9.6 oz (76.5 kg)   LMP 04/06/2021   BMI 31.86 kg/m   Assessment & Plan:  ARACELLI WOLOSZYN comes in today with chief complaint of New Patient (Initial Visit)   Diagnosis and orders addressed:  1. Need for  immunization against influenza - Flu Vaccine QUAD 78moIM (Fluarix, Fluzone & Alfiuria Quad PF) - CMP14+EGFR - CBC with Differential/Platelet  2. Annual physical exam - CMP14+EGFR - CBC with Differential/Platelet  3. Type 2 diabetes mellitus with other specified complication, without long-term current use of insulin (HCC) A1C improved from ED!!! Continue low carb diet  Glucose meter Discussed low carb diet  Will follow up with me in 1 month and JNaomafor education  - Bayer DCA Hb A1c Waived - CMP14+EGFR - CBC with Differential/Platelet - Lipid panel - Microalbumin / creatinine urine ratio - TSH - blood glucose meter kit and supplies; Dispense based on patient and insurance preference. Use up to four times daily as directed. (FOR ICD-10 E10.9, E11.9).  Dispense: 1 each; Refill: 0  4. Diabetic retinal edema (HCC) - Bayer DCA Hb A1c Waived - CMP14+EGFR - CBC with Differential/Platelet - Microalbumin / creatinine urine ratio  5. Right ankle swelling  - Compression stockings - DG Ankle Complete Right  6. Charcot's joint of right foot - Ambulatory referral to Orthopedic Surgery  Labs pending Health Maintenance reviewed Diet and exercise encouraged  Follow up plan: 1 month    CEvelina Dun FNP

## 2022-04-06 NOTE — Addendum Note (Signed)
Addended by: Jannifer Rodney A on: 04/06/2022 03:07 PM   Modules accepted: Orders

## 2022-04-06 NOTE — Addendum Note (Signed)
Addended by: Austin Miles F on: 04/06/2022 04:20 PM   Modules accepted: Orders

## 2022-04-06 NOTE — Addendum Note (Signed)
Addended by: Julious Payer D on: 04/06/2022 04:17 PM   Modules accepted: Orders

## 2022-04-06 NOTE — Patient Instructions (Signed)

## 2022-04-07 ENCOUNTER — Other Ambulatory Visit: Payer: Self-pay | Admitting: Family

## 2022-04-07 ENCOUNTER — Other Ambulatory Visit: Payer: Self-pay | Admitting: Family Medicine

## 2022-04-07 ENCOUNTER — Telehealth: Payer: Self-pay | Admitting: *Deleted

## 2022-04-07 DIAGNOSIS — E1169 Type 2 diabetes mellitus with other specified complication: Secondary | ICD-10-CM | POA: Insufficient documentation

## 2022-04-07 DIAGNOSIS — E119 Type 2 diabetes mellitus without complications: Secondary | ICD-10-CM | POA: Insufficient documentation

## 2022-04-07 LAB — CMP14+EGFR
ALT: 15 IU/L (ref 0–32)
AST: 14 IU/L (ref 0–40)
Albumin/Globulin Ratio: 1.5 (ref 1.2–2.2)
Albumin: 4.2 g/dL (ref 3.8–4.9)
Alkaline Phosphatase: 88 IU/L (ref 44–121)
BUN/Creatinine Ratio: 36 — ABNORMAL HIGH (ref 9–23)
BUN: 32 mg/dL — ABNORMAL HIGH (ref 6–24)
Bilirubin Total: 0.5 mg/dL (ref 0.0–1.2)
CO2: 24 mmol/L (ref 20–29)
Calcium: 9.8 mg/dL (ref 8.7–10.2)
Chloride: 103 mmol/L (ref 96–106)
Creatinine, Ser: 0.9 mg/dL (ref 0.57–1.00)
Globulin, Total: 2.8 g/dL (ref 1.5–4.5)
Glucose: 142 mg/dL — ABNORMAL HIGH (ref 70–99)
Potassium: 4.2 mmol/L (ref 3.5–5.2)
Sodium: 141 mmol/L (ref 134–144)
Total Protein: 7 g/dL (ref 6.0–8.5)
eGFR: 76 mL/min/{1.73_m2} (ref >59)

## 2022-04-07 LAB — CBC WITH DIFFERENTIAL/PLATELET
Basophils Absolute: 0.1 10*3/uL (ref 0.0–0.2)
Basos: 1 %
EOS (ABSOLUTE): 0.2 10*3/uL (ref 0.0–0.4)
Eos: 3 %
Hematocrit: 36.6 % (ref 34.0–46.6)
Hemoglobin: 12.3 g/dL (ref 11.1–15.9)
Immature Grans (Abs): 0 10*3/uL (ref 0.0–0.1)
Immature Granulocytes: 0 %
Lymphocytes Absolute: 1.5 10*3/uL (ref 0.7–3.1)
Lymphs: 17 %
MCH: 28.7 pg (ref 26.6–33.0)
MCHC: 33.6 g/dL (ref 31.5–35.7)
MCV: 86 fL (ref 79–97)
Monocytes Absolute: 0.5 10*3/uL (ref 0.1–0.9)
Monocytes: 5 %
Neutrophils Absolute: 6.4 10*3/uL (ref 1.4–7.0)
Neutrophils: 74 %
Platelets: 277 10*3/uL (ref 150–450)
RBC: 4.28 x10E6/uL (ref 3.77–5.28)
RDW: 14.1 % (ref 11.7–15.4)
WBC: 8.6 10*3/uL (ref 3.4–10.8)

## 2022-04-07 LAB — TSH: TSH: 1.79 u[IU]/mL (ref 0.450–4.500)

## 2022-04-07 LAB — MICROALBUMIN / CREATININE URINE RATIO
Creatinine, Urine: 68.9 mg/dL
Microalb/Creat Ratio: 27 mg/g creat (ref 0–29)
Microalbumin, Urine: 18.9 ug/mL

## 2022-04-07 LAB — LIPID PANEL
Chol/HDL Ratio: 5 ratio — ABNORMAL HIGH (ref 0.0–4.4)
Cholesterol, Total: 227 mg/dL — ABNORMAL HIGH (ref 100–199)
HDL: 45 mg/dL (ref >39)
LDL Chol Calc (NIH): 162 mg/dL — ABNORMAL HIGH (ref 0–99)
Triglycerides: 112 mg/dL (ref 0–149)
VLDL Cholesterol Cal: 20 mg/dL (ref 5–40)

## 2022-04-07 MED ORDER — ONETOUCH ULTRASOFT LANCETS MISC: 3 refills | Status: DC

## 2022-04-07 MED ORDER — LISINOPRIL 5 MG PO TABS: 5.0000 mg | ORAL_TABLET | Freq: Every day | ORAL | 3 refills | Status: DC

## 2022-04-07 MED ORDER — ONETOUCH ULTRA VI STRP: ORAL_STRIP | 3 refills | Status: DC

## 2022-04-07 MED ORDER — ROSUVASTATIN CALCIUM 5 MG PO TABS: 5.0000 mg | ORAL_TABLET | Freq: Every day | ORAL | 3 refills | Status: DC

## 2022-04-07 MED ORDER — BLOOD GLUCOSE METER KIT: PACK | 0 refills | Status: AC

## 2022-04-07 NOTE — Telephone Encounter (Signed)
Fax from [facility] on [address] RE: One Touch test strips use 4 times daily as directed Message: additional qty: MD call [phone], max 51 Qty in 3 days, please call plan to initiate PA, PT ID # [id number] Since pt is not on any diabetic medications, I am first going to change directions to test daily & as needed w/ Qty #100 for 90d supply which follows MCR guidelines.

## 2022-04-09 MED ORDER — ONETOUCH ULTRA VI STRP: ORAL_STRIP | 3 refills | Status: AC

## 2022-04-09 MED ORDER — ONETOUCH ULTRASOFT LANCETS MISC: 3 refills | Status: AC

## 2022-04-09 NOTE — Telephone Encounter (Signed)
Fax from [employer] on [address] RE: One touch test strips test BS daily and as needed Message: plan requires specific directions, above directions not specific enough Changed to test BS daily and resnt

## 2022-04-09 NOTE — Addendum Note (Signed)
Addended by: Julious Payer D on: 04/09/2022 09:52 AM   Modules accepted: Orders

## 2022-04-21 IMAGING — CT CT ABD-PELV W/ CM
2 of 5 series · 17 of 46 positions shown, 19 images · IV contrast (APPLIED)
Comparison: None.

CLINICAL DATA: Right lower quadrant pain. Concern for
pyelonephritis.

EXAM:
CT ABDOMEN AND PELVIS WITH CONTRAST
TECHNIQUE: Multidetector CT imaging of the abdomen and pelvis was performed
using the standard protocol following bolus administration of
intravenous contrast.

[Series 2: abd pel w · axial · 0.92mm/px · z∈[-352,+63]mm · 14 of 95 slices shown, 16 images]
[im 6/95  soft-tissue]
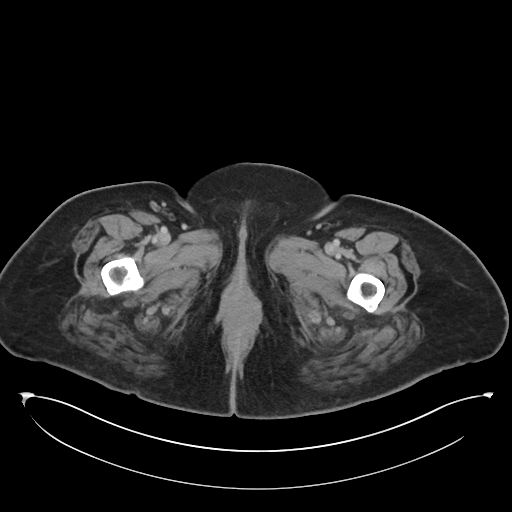
[im 6/95  bone]
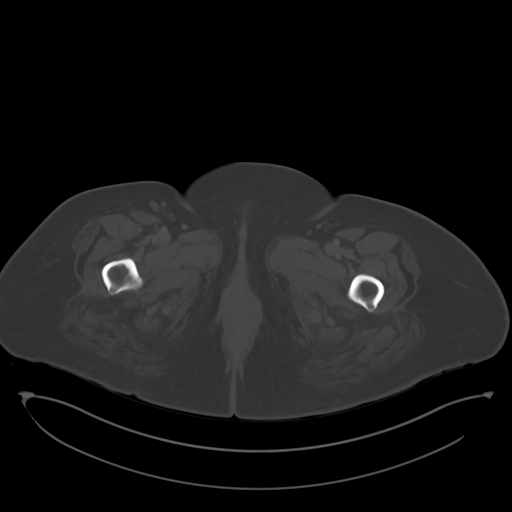
[im 11/95  soft-tissue]
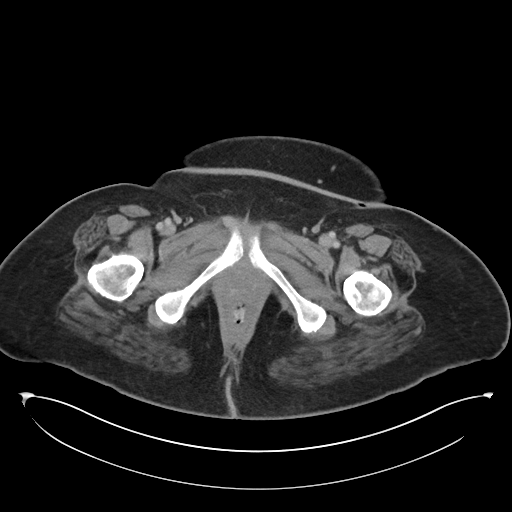
[im 21/95  soft-tissue]
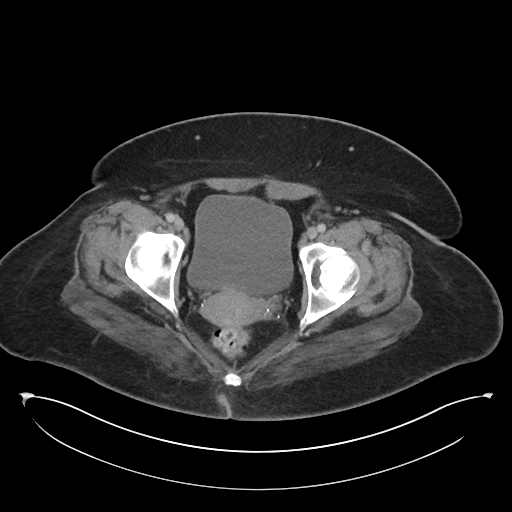
[im 27/95  soft-tissue]
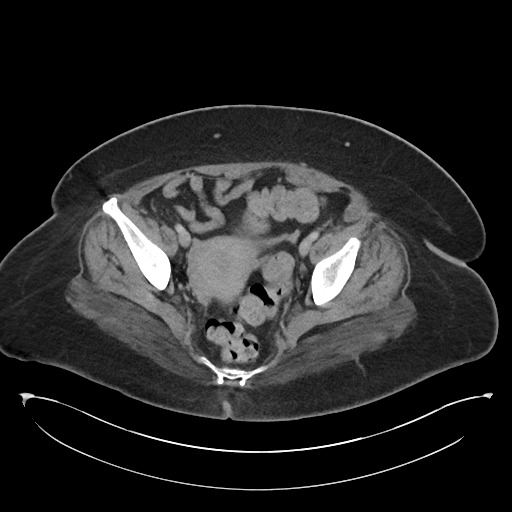
[im 32/95  soft-tissue]
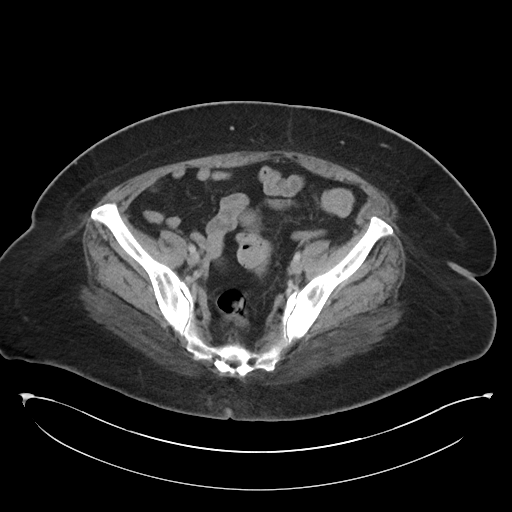
[im 37/95  soft-tissue]
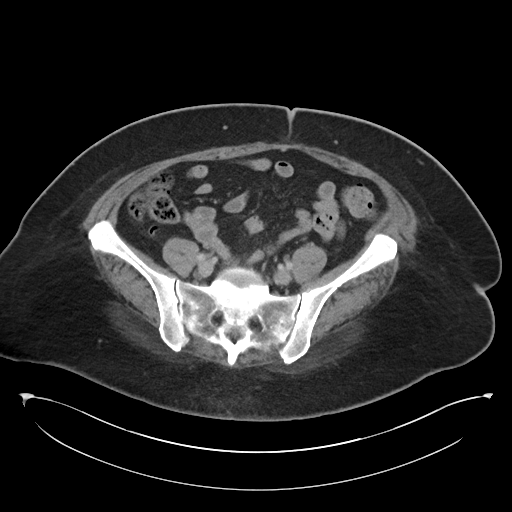
[im 42/95  soft-tissue]
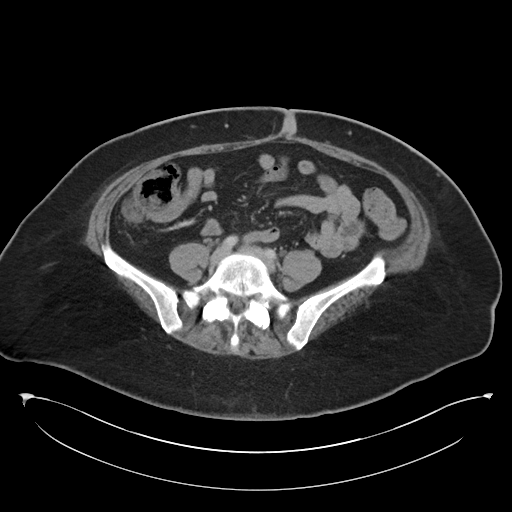
[im 53/95  soft-tissue]
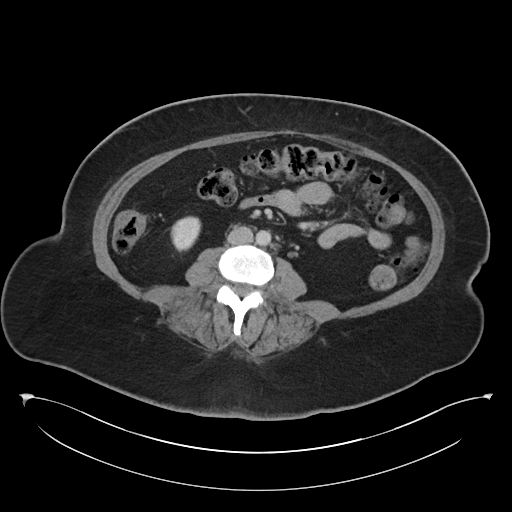
[im 58/95  soft-tissue]
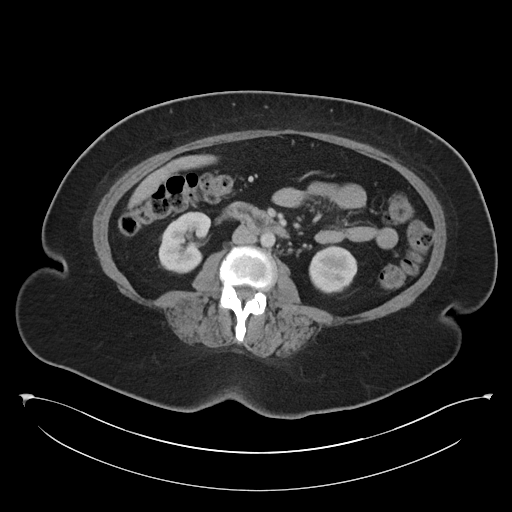
[im 58/95  bone]
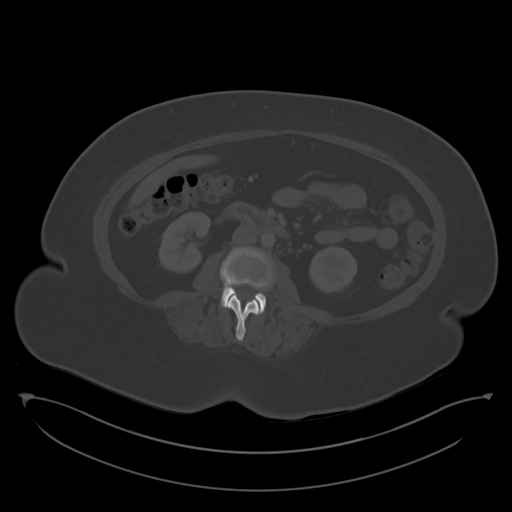
[im 63/95  soft-tissue]
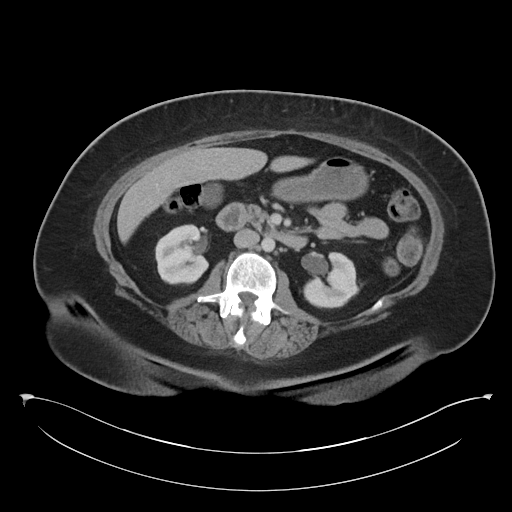
[im 68/95  soft-tissue]
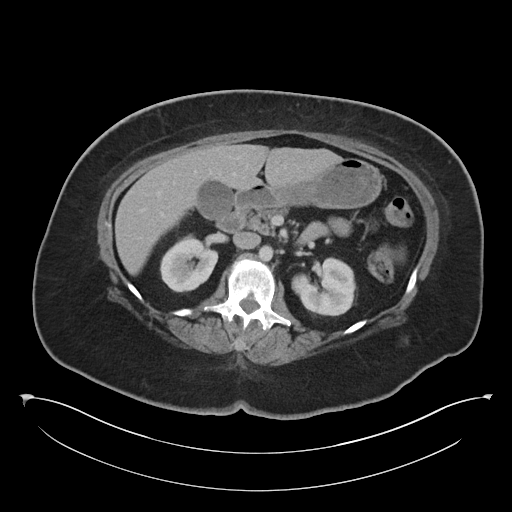
[im 74/95  soft-tissue]
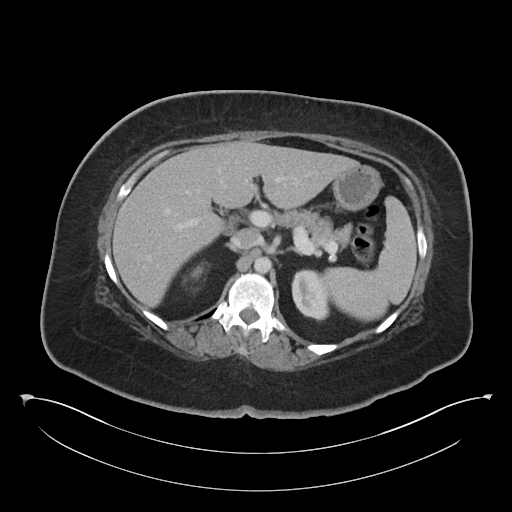
[im 84/95  soft-tissue]
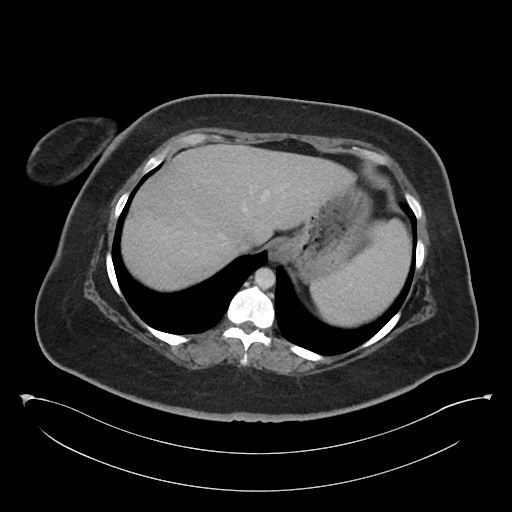
[im 89/95  soft-tissue]
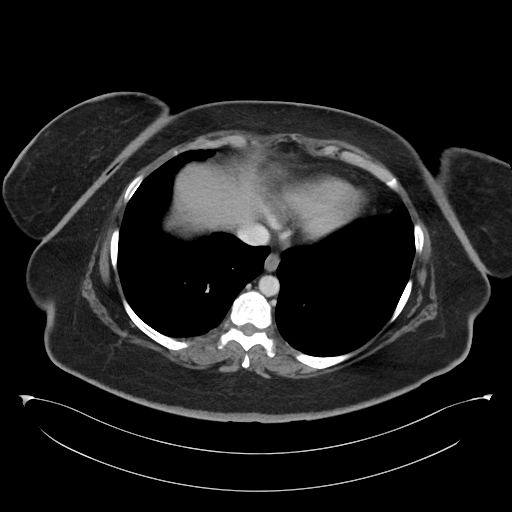

[Series 5: coronal · coronal · 0.95mm/px · 3 of 107 slices shown]
[im 36/107  soft-tissue]
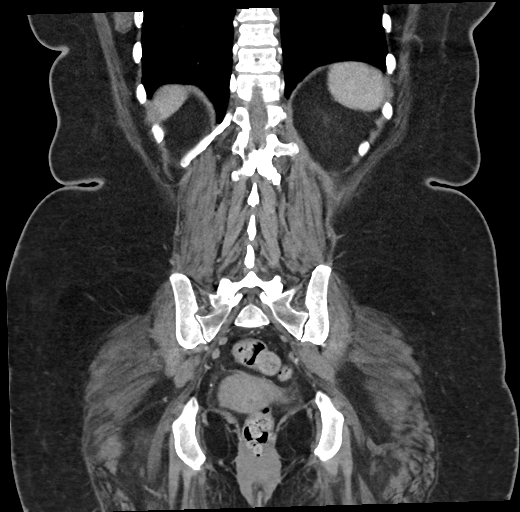
[im 48/107  soft-tissue]
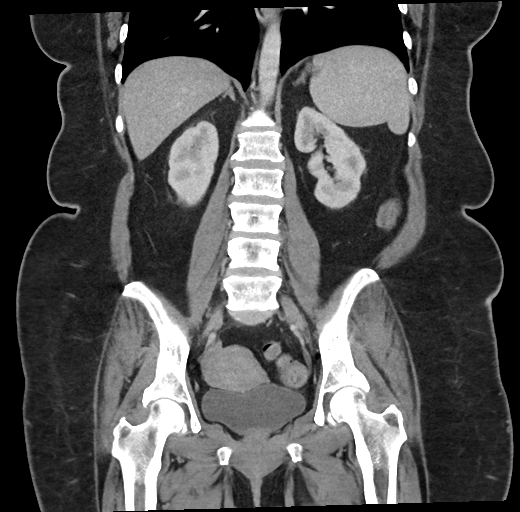
[im 59/107  soft-tissue]
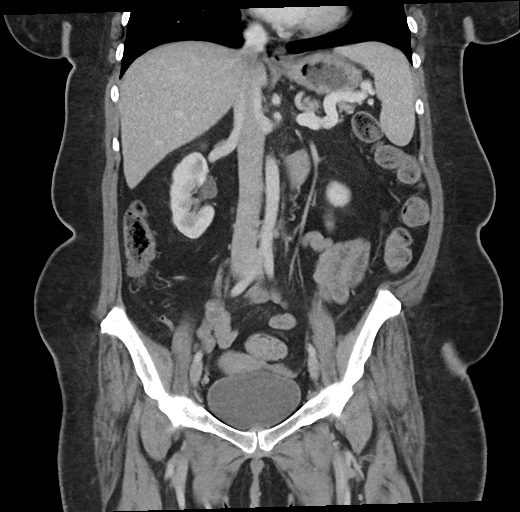

[17 of 46 positions shown; findings below may reference images not displayed]

RADIATION DOSE REDUCTION: This exam was performed according to the
departmental dose-optimization program which includes automated
exposure control, adjustment of the mA and/or kV according to
patient size and/or use of iterative reconstruction technique.

CONTRAST:  85mL OMNIPAQUE IOHEXOL 300 MG/ML  SOLN
FINDINGS: Lower chest: Minimal scarring in the lingula.  No pleural effusion.

Hepatobiliary: No focal liver abnormality is seen. No gallstones,
gallbladder wall thickening, or biliary dilatation.

Pancreas: Unremarkable.

Spleen: Unremarkable.

Adrenals/Urinary Tract: Unremarkable adrenal glands. Symmetric
enhancement of the kidneys. No evidence of a renal mass, calculi, or
hydronephrosis. Unremarkable bladder.

Stomach/Bowel: The stomach is unremarkable. There is no evidence of
bowel obstruction or inflammation. The appendix is unremarkable.

Vascular/Lymphatic: Normal caliber of the abdominal aorta. No
enlarged lymph nodes.

Reproductive: Uterus and bilateral adnexa are unremarkable.

Other: No ascites or pneumoperitoneum.

Musculoskeletal: No acute osseous abnormality or suspicious osseous
lesion.
IMPRESSION: No acute abnormality identified in the abdomen or pelvis.

## 2022-04-24 DIAGNOSIS — M14679 Charcot's joint, unspecified ankle and foot: Secondary | ICD-10-CM | POA: Insufficient documentation

## 2022-05-07 ENCOUNTER — Ambulatory Visit (INDEPENDENT_AMBULATORY_CARE_PROVIDER_SITE_OTHER): Payer: No Typology Code available for payment source | Admitting: Pharmacist

## 2022-05-07 DIAGNOSIS — E1169 Type 2 diabetes mellitus with other specified complication: Secondary | ICD-10-CM | POA: Diagnosis not present

## 2022-05-07 NOTE — Progress Notes (Signed)
    05/07/2022 Name: Becky Townsend MRN: [id number] DOB: [birth year]   S:  51 yoF Presents for diabetes evaluation, education, and management.  Patient reports Diabetes was diagnosed in 06/2021.  She initially admitted to the hospital with an A1c of 10.9% in 06/2021 and has worked hard on diet/lifestyle to improve her outcomes.   She reports 50lb weight loss as well.  She reports compliance with medications.  Insurance coverage/medication affordability: [employer] commercial  Patient reports adherence with medications. Current diabetes medications include: n/a Current hypertension medications include: lisinopril Goal 130/80 Current hyperlipidemia medications include: rosuvastatin   Patient denies hypoglycemic events.   Patient reported dietary habits: Eats 3 meals/day Following a low carb diet  Patient-reported exercise habits: exercises regularly  O:  Lab Results  Component Value Date   HGBA1C 6.6 (H) 04/06/2022      Lipid Panel     Component Value Date/Time   CHOL 227 (H) 04/06/2022 1222   TRIG 112 04/06/2022 1222   HDL 45 04/06/2022 1222   CHOLHDL 5.0 (H) 04/06/2022 1222   LDLCALC 162 (H) 04/06/2022 1222     Home fasting blood sugars: <130  2 hour post-meal/random blood sugars: up to 170.    Clinical Atherosclerotic Cardiovascular Disease (ASCVD): No   The 10-year ASCVD risk score (Arnett DK, et al., 2019) is: 4.8%   Values used to calculate the score:     Age: 51 years     Sex: Female     Is Non-Hispanic African American: No     Diabetic: Yes     Tobacco smoker: No     Systolic Blood Pressure: 132 mmHg     Is BP treated: No     HDL Cholesterol: 45 mg/dL     Total Cholesterol: 227 mg/dL    A/P:  Diabetes E3PI currently controlled-A1c 6.6%  -Continue diet/lifestyle changes  Recommended 34-40 carbs per meal  -Increase water consumption  -Continue statin/ACEi  -She is not interested in pharmacotherapy at this time and is controlled on current  modifications. She has made great progress  -Continue one touch glucometer to test blood sugar  -Extensively discussed pathophysiology of diabetes, recommended lifestyle interventions, dietary effects on blood sugar control  -Counseled on s/sx of and management of hypoglycemia  -Next A1C anticipated 3-6 months.    Kieth Brightly, PharmD, BCPS, BCACP Clinical Pharmacist, [facility]  II  T [phone]

## 2022-05-08 ENCOUNTER — Encounter: Payer: Self-pay | Admitting: Family

## 2022-05-08 ENCOUNTER — Ambulatory Visit (INDEPENDENT_AMBULATORY_CARE_PROVIDER_SITE_OTHER): Payer: No Typology Code available for payment source | Admitting: Family

## 2022-05-08 VITALS — BP 124/79 | HR 96 | Temp 98.2°F | Ht 61.0 in | Wt 165.0 lb

## 2022-05-08 DIAGNOSIS — M14679 Charcot's joint, unspecified ankle and foot: Secondary | ICD-10-CM

## 2022-05-08 DIAGNOSIS — E1169 Type 2 diabetes mellitus with other specified complication: Secondary | ICD-10-CM | POA: Diagnosis not present

## 2022-05-08 DIAGNOSIS — Z1211 Encounter for screening for malignant neoplasm of colon: Secondary | ICD-10-CM

## 2022-05-08 DIAGNOSIS — E785 Hyperlipidemia, unspecified: Secondary | ICD-10-CM

## 2022-05-08 DIAGNOSIS — Z23 Encounter for immunization: Secondary | ICD-10-CM

## 2022-05-08 NOTE — Patient Instructions (Signed)
Diabetes Mellitus Basics  Diabetes mellitus, or diabetes, is a long-term (chronic) disease. It occurs when the body does not properly use sugar (glucose) that is released from food after you eat. Diabetes mellitus may be caused by one or both of these problems: Your pancreas does not make enough of a hormone called insulin. Your body does not react in a normal way to the insulin that it makes. Insulin lets glucose enter cells in your body. This gives you energy. If you have diabetes, glucose cannot get into cells. This causes high blood glucose (hyperglycemia). How to treat and manage diabetes You may need to take insulin or other diabetes medicines daily to keep your glucose in balance. If you are prescribed insulin, you will learn how to give yourself insulin by injection. You may need to adjust the amount of insulin you take based on the foods that you eat. You will need to check your blood glucose levels using a glucose monitor as told by your health care provider. The readings can help determine if you have low or high blood glucose. Generally, you should have these blood glucose levels: Before meals (preprandial): 80-130 mg/dL (4.4-7.2 mmol/L). After meals (postprandial): below 180 mg/dL (10 mmol/L). Hemoglobin A1c (HbA1c) level: less than 7%. Your health care provider will set treatment goals for you. Keep all follow-up visits. This is important. Follow these instructions at home: Diabetes medicines Take your diabetes medicines every day as told by your health care provider. List your diabetes medicines here: Name of medicine: ______________________________ Amount (dose): _______________ Time (a.m./p.m.): _______________ Notes: ___________________________________ Name of medicine: ______________________________ Amount (dose): _______________ Time (a.m./p.m.): _______________ Notes: ___________________________________ Name of medicine: ______________________________ Amount (dose):  _______________ Time (a.m./p.m.): _______________ Notes: ___________________________________ Insulin If you use insulin, list the types of insulin you use here: Insulin type: ______________________________ Amount (dose): _______________ Time (a.m./p.m.): _______________Notes: ___________________________________ Insulin type: ______________________________ Amount (dose): _______________ Time (a.m./p.m.): _______________ Notes: ___________________________________ Insulin type: ______________________________ Amount (dose): _______________ Time (a.m./p.m.): _______________ Notes: ___________________________________ Insulin type: ______________________________ Amount (dose): _______________ Time (a.m./p.m.): _______________ Notes: ___________________________________ Insulin type: ______________________________ Amount (dose): _______________ Time (a.m./p.m.): _______________ Notes: ___________________________________ Managing blood glucose  Check your blood glucose levels using a glucose monitor as told by your health care provider. Write down the times that you check your glucose levels here: Time: _______________ Notes: ___________________________________ Time: _______________ Notes: ___________________________________ Time: _______________ Notes: ___________________________________ Time: _______________ Notes: ___________________________________ Time: _______________ Notes: ___________________________________ Time: _______________ Notes: ___________________________________  Low blood glucose Low blood glucose (hypoglycemia) is when glucose is at or below 70 mg/dL (3.9 mmol/L). Symptoms may include: Feeling: Hungry. Sweaty and clammy. Irritable or easily upset. Dizzy. Sleepy. Having: A fast heartbeat. A headache. A change in your vision. Numbness around the mouth, lips, or tongue. Having trouble with: Moving (coordination). Sleeping. Treating low blood glucose To treat low blood  glucose, eat or drink something containing sugar right away. If you can think clearly and swallow safely, follow the 15:15 rule: Take 15 grams of a fast-acting carb (carbohydrate), as told by your health care provider. Some fast-acting carbs are: Glucose tablets: take 3-4 tablets. Hard candy: eat 3-5 pieces. Fruit juice: drink 4 oz (120 mL). Regular (not diet) soda: drink 4-6 oz (120-180 mL). Honey or sugar: eat 1 Tbsp (15 mL). Check your blood glucose levels 15 minutes after you take the carb. If your glucose is still at or below 70 mg/dL (3.9 mmol/L), take 15 grams of a carb again. If your glucose does not go above 70 mg/dL (3.9 mmol/L) after   3 tries, get help right away. After your glucose goes back to normal, eat a meal or a snack within 1 hour. Treating very low blood glucose If your glucose is at or below 54 mg/dL (3 mmol/L), you have very low blood glucose (severe hypoglycemia). This is an emergency. Do not wait to see if the symptoms will go away. Get medical help right away. Call your local emergency services (911 in the [place] Do not drive yourself to the hospital. Questions to ask your health care provider Should I talk with a diabetes educator? What equipment will I need to care for myself at home? What diabetes medicines do I need? When should I take them? How often do I need to check my blood glucose levels? What number can I call if I have questions? When is my follow-up visit? Where can I find a support group for people with diabetes? Where to find more information American Diabetes Association: www.diabetes.org Association of Diabetes Care and Education Specialists: www.diabeteseducator.org Contact a health care provider if: Your blood glucose is at or above 240 mg/dL (13.3 mmol/L) for 2 days in a row. You have been sick or have had a fever for 2 days or more, and you are not getting better. You have any of these problems for more than 6 hours: You cannot eat or  drink. You feel nauseous. You vomit. You have diarrhea. Get help right away if: Your blood glucose is lower than 54 mg/dL (3 mmol/L). You get confused. You have trouble thinking clearly. You have trouble breathing. These symptoms may represent a serious problem that is an emergency. Do not wait to see if the symptoms will go away. Get medical help right away. Call your local emergency services (911 in the [place] Do not drive yourself to the hospital. Summary Diabetes mellitus is a chronic disease that occurs when the body does not properly use sugar (glucose) that is released from food after you eat. Take insulin and diabetes medicines as told. Check your blood glucose every day, as often as told. Keep all follow-up visits. This is important. This information is not intended to replace advice given to you by your health care provider. Make sure you discuss any questions you have with your health care provider. Document Revised: 09/12/2019 Document Reviewed: 09/12/2019 Elsevier Patient Education  2023 Elsevier Inc.  

## 2022-05-08 NOTE — Progress Notes (Signed)
Subjective:    Patient ID: Becky Townsend, female    DOB: 12/16/1970, 51 y.o.   MRN: [id number]  Chief Complaint  Patient presents with   Medical Management of Chronic Issues   Pt presents to the office today for chronic follow up. She was seen last month as a new patient and diagnosed with charcot of right foot. She has seen Ortho and is in a boot.   She is a new diabetic, but since finding out she has changed her diet. She has lost 47 lbs since Feb 2023.     05/08/2022    2:53 PM 04/06/2022   11:46 AM 07/21/2021   10:36 AM  Last 3 Weights  Weight (lbs) 165 lb 168 lb 9.6 oz 212 lb 11.9 oz  Weight (kg) 74.844 kg 76.476 kg 96.5 kg     Diabetes She presents for her follow-up diabetic visit. She has type 2 diabetes mellitus. Associated symptoms include foot paresthesias. Pertinent negatives for diabetes include no blurred vision. Symptoms are stable. Diabetic complications include peripheral neuropathy. Risk factors for coronary artery disease include dyslipidemia, diabetes mellitus, hypertension and sedentary lifestyle. She is following a generally healthy diet. Her overall blood glucose range is 110-130 mg/dl. An ACE inhibitor/angiotensin II receptor blocker is being taken. Eye exam is current.  Hyperlipidemia This is a chronic problem. The current episode started more than 1 year ago. The problem is controlled. Exacerbating diseases include obesity. Current antihyperlipidemic treatment includes statins. The current treatment provides moderate improvement of lipids. Risk factors for coronary artery disease include diabetes mellitus, dyslipidemia, hypertension and a sedentary lifestyle.      Review of Systems  Eyes:  Negative for blurred vision.  All other systems reviewed and are negative.      Objective:   Physical Exam Vitals reviewed.  Constitutional:      General: She is not in acute distress.    Appearance: She is well-developed. She is obese.  HENT:     Head:  Normocephalic and atraumatic.     Right Ear: Tympanic membrane normal.     Left Ear: Tympanic membrane normal.  Eyes:     Pupils: Pupils are equal, round, and reactive to light.  Neck:     Thyroid: No thyromegaly.  Cardiovascular:     Rate and Rhythm: Normal rate and regular rhythm.     Heart sounds: Normal heart sounds. No murmur heard. Pulmonary:     Effort: Pulmonary effort is normal. No respiratory distress.     Breath sounds: Normal breath sounds. No wheezing.  Abdominal:     General: Bowel sounds are normal. There is no distension.     Palpations: Abdomen is soft.     Tenderness: There is no abdominal tenderness.  Musculoskeletal:        General: Tenderness present.     Cervical back: Normal range of motion and neck supple.     Comments: Right boot on  Skin:    General: Skin is warm and dry.  Neurological:     Mental Status: She is alert and oriented to person, place, and time.     Cranial Nerves: No cranial nerve deficit.     Deep Tendon Reflexes: Reflexes are normal and symmetric.  Psychiatric:        Behavior: Behavior normal.        Thought Content: Thought content normal.        Judgment: Judgment normal.     Diabetic Foot Exam - Simple  Simple Foot Form Diabetic Foot exam was performed with the following findings: Yes 05/08/2022  3:18 PM  Visual Inspection See comments: Yes Sensation Testing Intact to touch and monofilament testing bilaterally: Yes See comments: Yes Pulse Check Posterior Tibialis and Dorsalis pulse intact bilaterally: Yes Comments Right foot deformity charcot, negative monofilament in bilateral toes and ball of foot     BP 124/79   Pulse 96   Temp 98.2 F (36.8 C)   Ht 5\' 1"  (1.549 m)   Wt 165 lb (74.8 kg)   SpO2 98%   BMI 31.18 kg/m      Assessment & Plan:  Becky Townsend comes in today with chief complaint of Medical Management of Chronic Issues   Diagnosis and orders addressed: 1. Type 2 diabetes mellitus with other  specified complication, without long-term current use of insulin (HCC)  2. Charcot arthropathy of midfoot  3. Hyperlipidemia associated with type 2 diabetes mellitus (HCC)  4. Colon cancer screening - Ambulatory referral to Gastroenterology  5. Need for shingles vaccine   Health Maintenance reviewed Diet and exercise encouraged  Follow up plan: 2 months    Roselind Messier, FNP

## 2022-06-05 LAB — HM DIABETES EYE EXAM

## 2022-07-09 ENCOUNTER — Ambulatory Visit (INDEPENDENT_AMBULATORY_CARE_PROVIDER_SITE_OTHER): Payer: 59 | Admitting: Family

## 2022-07-09 ENCOUNTER — Encounter: Payer: Self-pay | Admitting: Family

## 2022-07-09 VITALS — BP 122/83 | HR 100 | Temp 97.1°F | Resp 20 | Ht 61.0 in | Wt 158.0 lb

## 2022-07-09 DIAGNOSIS — Z23 Encounter for immunization: Secondary | ICD-10-CM | POA: Diagnosis not present

## 2022-07-09 DIAGNOSIS — E785 Hyperlipidemia, unspecified: Secondary | ICD-10-CM | POA: Diagnosis not present

## 2022-07-09 DIAGNOSIS — E1169 Type 2 diabetes mellitus with other specified complication: Secondary | ICD-10-CM | POA: Diagnosis not present

## 2022-07-09 DIAGNOSIS — Z1211 Encounter for screening for malignant neoplasm of colon: Secondary | ICD-10-CM

## 2022-07-09 DIAGNOSIS — M14679 Charcot's joint, unspecified ankle and foot: Secondary | ICD-10-CM

## 2022-07-09 NOTE — Patient Instructions (Signed)

## 2022-07-09 NOTE — Addendum Note (Signed)
Addended by: Evelina Dun A on: 07/09/2022 03:51 PM   Modules accepted: Level of Service

## 2022-07-09 NOTE — Addendum Note (Signed)
Addended by: Michaela Corner on: 07/09/2022 04:02 PM   Modules accepted: Orders

## 2022-07-09 NOTE — Progress Notes (Signed)
Subjective:    Patient ID: Becky Townsend, female    DOB: [birth year], 52 y.o.   MRN: [id number]  No chief complaint on file.  Pt presents to the office today for chronic follow up. She has been diagnosed charcot of right foot. She has seen Ortho and is in a boot.    She is a new diabetic, but since finding out she has changed her diet. She has lost 53 lbs since Feb 2023.     07/09/2022    3:31 PM 05/08/2022    2:53 PM 04/06/2022   11:46 AM  Last 3 Weights  Weight (lbs) 158 lb 165 lb 168 lb 9.6 oz  Weight (kg) 71.668 kg 74.844 kg 76.476 kg     Diabetes She presents for her follow-up diabetic visit. She has type 2 diabetes mellitus. There are no hypoglycemic associated symptoms. Pertinent negatives for diabetes include no blurred vision and no foot paresthesias. Symptoms are stable. Risk factors for coronary artery disease include dyslipidemia, diabetes mellitus, hypertension and sedentary lifestyle. She is following a generally healthy diet. Her overall blood glucose range is 90-110 mg/dl.  Hyperlipidemia This is a chronic problem. The current episode started more than 1 year ago. The problem is controlled. Exacerbating diseases include obesity. Current antihyperlipidemic treatment includes statins. The current treatment provides moderate improvement of lipids. Risk factors for coronary artery disease include dyslipidemia, diabetes mellitus, hypertension, a sedentary lifestyle and post-menopausal.      Review of Systems  Eyes:  Negative for blurred vision.  All other systems reviewed and are negative.      Objective:   Physical Exam Vitals reviewed.  Constitutional:      General: She is not in acute distress.    Appearance: She is well-developed. She is obese.  HENT:     Head: Normocephalic and atraumatic.     Right Ear: Tympanic membrane normal.     Left Ear: Tympanic membrane normal.  Eyes:     Pupils: Pupils are equal, round, and reactive to light.  Neck:      Thyroid: No thyromegaly.  Cardiovascular:     Rate and Rhythm: Normal rate and regular rhythm.     Heart sounds: Normal heart sounds. No murmur heard. Pulmonary:     Effort: Pulmonary effort is normal. No respiratory distress.     Breath sounds: Normal breath sounds. No wheezing.  Abdominal:     General: Bowel sounds are normal. There is no distension.     Palpations: Abdomen is soft.     Tenderness: There is no abdominal tenderness.  Musculoskeletal:        General: Swelling (ortho boot present of right foot) present. No tenderness. Normal range of motion.     Cervical back: Normal range of motion and neck supple.  Skin:    General: Skin is warm and dry.  Neurological:     Mental Status: She is alert and oriented to person, place, and time.     Cranial Nerves: No cranial nerve deficit.     Deep Tendon Reflexes: Reflexes are normal and symmetric.  Psychiatric:        Behavior: Behavior normal.        Thought Content: Thought content normal.        Judgment: Judgment normal.       BP 122/83   Pulse 100   Temp (!) 97.1 F (36.2 C) (Oral)   Resp 20   Ht 5' 1"$  (1.549 m)   Wt 158  lb (71.7 kg)   SpO2 98%   BMI 29.85 kg/m      Assessment & Plan:  Becky Townsend comes in today with chief complaint of Medical Management of Chronic Issues   Diagnosis and orders addressed:  1. Type 2 diabetes mellitus with other specified complication, without long-term current use of insulin (HCC) - Bayer DCA Hb A1c Waived; Future - CBC with Differential/Platelet - CMP14+EGFR  2. Hyperlipidemia associated with type 2 diabetes mellitus ([place]) - Bayer DCA Hb A1c Waived; Future - CBC with Differential/Platelet - CMP14+EGFR  3. Charcot arthropathy of midfoot  4. Colon cancer screening - Ambulatory referral to Gastroenterology   Labs pending Health Maintenance reviewed Diet and exercise encouraged  Follow up plan: 6 months    Evelina Dun, FNP

## 2022-07-10 LAB — CMP14+EGFR
ALT: 30 IU/L (ref 0–32)
AST: 21 IU/L (ref 0–40)
Albumin/Globulin Ratio: 1.9 (ref 1.2–2.2)
Albumin: 4.3 g/dL (ref 3.8–4.9)
Alkaline Phosphatase: 75 IU/L (ref 44–121)
BUN/Creatinine Ratio: 36 — ABNORMAL HIGH (ref 9–23)
BUN: 29 mg/dL — ABNORMAL HIGH (ref 6–24)
Bilirubin Total: 0.3 mg/dL (ref 0.0–1.2)
CO2: 24 mmol/L (ref 20–29)
Calcium: 9.3 mg/dL (ref 8.7–10.2)
Chloride: 107 mmol/L — ABNORMAL HIGH (ref 96–106)
Creatinine, Ser: 0.81 mg/dL (ref 0.57–1.00)
Globulin, Total: 2.3 g/dL (ref 1.5–4.5)
Glucose: 139 mg/dL — ABNORMAL HIGH (ref 70–99)
Potassium: 4.4 mmol/L (ref 3.5–5.2)
Sodium: 144 mmol/L (ref 134–144)
Total Protein: 6.6 g/dL (ref 6.0–8.5)
eGFR: 87 mL/min/{1.73_m2} (ref >59)

## 2022-07-10 LAB — CBC WITH DIFFERENTIAL/PLATELET
Basophils Absolute: 0 10*3/uL (ref 0.0–0.2)
Basos: 1 %
EOS (ABSOLUTE): 0.2 10*3/uL (ref 0.0–0.4)
Eos: 2 %
Hematocrit: 37.3 % (ref 34.0–46.6)
Hemoglobin: 12.3 g/dL (ref 11.1–15.9)
Immature Grans (Abs): 0 10*3/uL (ref 0.0–0.1)
Immature Granulocytes: 0 %
Lymphocytes Absolute: 1.9 10*3/uL (ref 0.7–3.1)
Lymphs: 24 %
MCH: 28.7 pg (ref 26.6–33.0)
MCHC: 33 g/dL (ref 31.5–35.7)
MCV: 87 fL (ref 79–97)
Monocytes Absolute: 0.4 10*3/uL (ref 0.1–0.9)
Monocytes: 5 %
Neutrophils Absolute: 5.5 10*3/uL (ref 1.4–7.0)
Neutrophils: 68 %
Platelets: 236 10*3/uL (ref 150–450)
RBC: 4.29 x10E6/uL (ref 3.77–5.28)
RDW: 14.2 % (ref 11.7–15.4)
WBC: 8 10*3/uL (ref 3.4–10.8)

## 2022-07-13 ENCOUNTER — Other Ambulatory Visit: Payer: Self-pay | Admitting: Family

## 2022-07-13 DIAGNOSIS — Z1231 Encounter for screening mammogram for malignant neoplasm of breast: Secondary | ICD-10-CM

## 2022-07-15 ENCOUNTER — Inpatient Hospital Stay: Admission: RE | Admit: 2022-07-15 | Payer: Self-pay | Source: Ambulatory Visit

## 2022-09-09 ENCOUNTER — Encounter: Payer: Self-pay | Admitting: Family

## 2022-09-11 ENCOUNTER — Encounter (HOSPITAL_BASED_OUTPATIENT_CLINIC_OR_DEPARTMENT_OTHER): Payer: Self-pay | Admitting: Emergency Medicine

## 2022-09-11 ENCOUNTER — Other Ambulatory Visit: Payer: Self-pay

## 2022-09-11 ENCOUNTER — Emergency Department (HOSPITAL_BASED_OUTPATIENT_CLINIC_OR_DEPARTMENT_OTHER)
Admission: EM | Admit: 2022-09-11 | Discharge: 2022-09-11 | Disposition: A | Discharge status: Home / Self Care | Payer: 59 | Attending: Emergency Medicine | Admitting: Emergency Medicine

## 2022-09-11 ENCOUNTER — Emergency Department (HOSPITAL_BASED_OUTPATIENT_CLINIC_OR_DEPARTMENT_OTHER): Payer: 59

## 2022-09-11 DIAGNOSIS — E119 Type 2 diabetes mellitus without complications: Secondary | ICD-10-CM | POA: Diagnosis not present

## 2022-09-11 DIAGNOSIS — R55 Syncope and collapse: Secondary | ICD-10-CM | POA: Diagnosis present

## 2022-09-11 LAB — COMPREHENSIVE METABOLIC PANEL
ALT: 44 U/L (ref 0–44)
AST: 28 U/L (ref 15–41)
Albumin: 4.2 g/dL (ref 3.5–5.0)
Alkaline Phosphatase: 58 U/L (ref 38–126)
Anion gap: 10 (ref 5–15)
BUN: 39 mg/dL — ABNORMAL HIGH (ref 6–20)
CO2: 27 mmol/L (ref 22–32)
Calcium: 9.7 mg/dL (ref 8.9–10.3)
Chloride: 104 mmol/L (ref 98–111)
Creatinine, Ser: 0.86 mg/dL (ref 0.44–1.00)
GFR, Estimated: >60 mL/min (ref >60)
Glucose, Bld: 108 mg/dL — ABNORMAL HIGH (ref 70–99)
Potassium: 4.2 mmol/L (ref 3.5–5.1)
Sodium: 141 mmol/L (ref 135–145)
Total Bilirubin: 0.5 mg/dL (ref 0.3–1.2)
Total Protein: 7.1 g/dL (ref 6.5–8.1)

## 2022-09-11 LAB — CBC
HCT: 36.6 % (ref 36.0–46.0)
Hemoglobin: 12.2 g/dL (ref 12.0–15.0)
MCH: 29.4 pg (ref 26.0–34.0)
MCHC: 33.3 g/dL (ref 30.0–36.0)
MCV: 88.2 fL (ref 80.0–100.0)
Platelets: 186 10*3/uL (ref 150–400)
RBC: 4.15 MIL/uL (ref 3.87–5.11)
RDW: 14 % (ref 11.5–15.5)
WBC: 7.9 10*3/uL (ref 4.0–10.5)
nRBC: 0 % (ref 0.0–0.2)

## 2022-09-11 LAB — CBG MONITORING, ED: Glucose-Capillary: 115 mg/dL — ABNORMAL HIGH (ref 70–99)

## 2022-09-11 NOTE — ED Triage Notes (Signed)
Syncopal episode. Possible seizure activity per family. Staring off, lasted a few seconds. Woke up confused. Happened around 12:30 tonight. Larey Seat hit back of head per family. Aox4 gcs 15 now  No injury noted, no c/o pain.

## 2022-09-11 NOTE — ED Provider Notes (Signed)
[place] EMERGENCY DEPARTMENT AT [facility]  Provider Note  CSN: [id number] Arrival date & time: 09/11/22 0104  History Chief Complaint  Patient presents with   Loss of Consciousness    Becky Townsend is a 52 y.o. female with history of diet controlled diabetes brought to the ED by husband and daughter who are at bedside and supplement history. She was in her usual state of health today. Around 0030hrs she was standing at the kitchen sink talk to her daughter when she had sudden loss of consciousness falling to the ground and hitting her head. She had some arm/hand spasms but no reported generalized tonic/clonic activity. She was unconscious for less than a minute and then returned to baseline about 10 seconds later. She denies any prodromal headache, dizziness, chest pain, palpitations, nausea or vomiting. No recent changes in medications (lisinopril  and Crestor  daily). She denies any recent illness.    Home Medications Prior to Admission medications   Medication Sig Start Date End Date Taking? Authorizing Provider  blood glucose meter kit and supplies Dispense based on patient and insurance preference. Use up to four times daily as directed. (FOR ICD-10 E10.9, E11.9). 04/06/22   [name], FNP  blood glucose meter kit and supplies E11 whatever insurance will pay for 04/07/22   Jannifer Rodney A, FNP  Blood Glucose Monitoring Suppl (ONE TOUCH ULTRA 2) w/Device KIT Use up to four times daily Dx E11.69 04/06/22   [name], FNP  glucose blood (ONETOUCH ULTRA) test strip Test BS daily Dx E11.69 04/09/22   [name], FNP  Lancets [facility] ULTRASOFT) lancets Test BS daily Dx E11.69 04/09/22   Jannifer Rodney A, FNP  lisinopril (ZESTRIL) 5 MG tablet Take 1 tablet (5 mg total) by mouth daily. 04/07/22   [name], FNP  rosuvastatin (CRESTOR) 5 MG tablet Take 1 tablet (5 mg total) by mouth daily. 04/07/22   [name], FNP  UNABLE TO FIND Med  Name: eye injection eylea    [provider]     Allergies    Percocet [oxycodone-acetaminophen] and Sudafed [pseudoephedrine]   Review of Systems   Review of Systems Please see HPI for pertinent positives and negatives  Physical Exam BP (!) 160/78   Pulse (!) 102   Temp 97.8 F (36.6 C) (Oral)   Resp 18   Ht  (1.549 m)   Wt 72 kg   SpO2 97%   BMI 29.99 kg/m   Physical Exam Vitals and nursing note reviewed.  Constitutional:      Appearance: Normal appearance.  HENT:     Head: Normocephalic and atraumatic.     Nose: Nose normal.     Mouth/Throat:     Mouth: Mucous membranes are moist.  Eyes:     Extraocular Movements: Extraocular movements intact.     Conjunctiva/sclera: Conjunctivae normal.  Cardiovascular:     Rate and Rhythm: Normal rate.     Heart sounds: Murmur (faint 1/6 murmur, not consistent with severe AS) heard.  Pulmonary:     Effort: Pulmonary effort is normal.     Breath sounds: Normal breath sounds.  Abdominal:     General: Abdomen is flat.     Palpations: Abdomen is soft.     Tenderness: There is no abdominal tenderness.  Musculoskeletal:        General: No swelling. Normal range of motion.     Cervical back: Neck supple.  Skin:  General: Skin is warm and dry.  Neurological:     General: No focal deficit present.     Mental Status: She is alert.  Psychiatric:        Mood and Affect: Mood normal.     ED Results / Procedures / Treatments   EKG EKG Interpretation  Date/Time:  Friday September 11 2022 01:17:58 EDT Ventricular Rate:  94 PR Interval:  172 QRS Duration: 82 QT Interval:  364 QTC Calculation: 455 R Axis:   76 Text Interpretation: Normal sinus rhythm Normal ECG No previous ECGs available Confirmed by Susy Frizzle [phone]) on 09/11/2022 1:52:51 AM  Procedures Procedures  Medications Ordered in the ED Medications - No data to display  Initial Impression and Plan  Patient here with what sounds like a  syncopal episode. Some reported myoclonus but no definite seizure activity and returned to baseline quickly. EKG without dysrhythmia. Labs done in triage show normal CBC and CMP. Will check CT head and orthostatics.   ED Course   Clinical Course as of 09/11/22 0311  Fri Sep 11, 2022  0306 I personally viewed the images from radiology studies and agree with radiologist interpretation: CT is normal.   Orthostatics are neg.   [CS]  0310 Patient remains asymptomatic. Her [place] Syncope Score is 0, low risk for adverse event. Recommend close outpatient PCP follow up for re-evaluation. RTED for any other concerns or further syncopal episodes.  [CS]    Clinical Course User Index [CS] Pollyann Savoy, MD     MDM Rules/Calculators/A&P Medical Decision Making Given presenting complaint, I considered that admission might be necessary. After review of results from ED lab and/or imaging studies, admission to the hospital is not indicated at this time.    Problems Addressed: Syncope, unspecified syncope type: acute illness or injury that poses a threat to life or bodily functions  Amount and/or Complexity of Data Reviewed Labs: ordered. Decision-making details documented in ED Course. Radiology: ordered and independent interpretation performed. Decision-making details documented in ED Course. ECG/medicine tests: ordered and independent interpretation performed. Decision-making details documented in ED Course.  Risk Decision regarding hospitalization.     Final Clinical Impression(s) / ED Diagnoses Final diagnoses:  Syncope, unspecified syncope type    Rx / DC Orders ED Discharge Orders     None        Pollyann Savoy, MD 09/11/22 [phone]

## 2022-09-14 ENCOUNTER — Telehealth: Payer: Self-pay

## 2022-09-14 NOTE — Transitions of Care (Post Inpatient/ED Visit) (Signed)
   09/14/2022  Name: Becky Townsend MRN: [id number] DOB: [birth year]  Today's TOC FU Call Status: Today's TOC FU Call Status:: Successful TOC FU Call Competed TOC FU Call Complete Date: 09/14/22  Transition Care Management Follow-up Telephone Call Date of Discharge: 09/11/22 Discharge Facility: [facility]) Type of Discharge: Emergency Department Reason for ED Visit: Other: (Passed out) How have you been since you were released from the hospital?: Better Any questions or concerns?: No  Items Reviewed: Did you receive and understand the discharge instructions provided?: Yes Medications obtained and verified?: Yes (Medications Reviewed) Any new allergies since your discharge?: No Dietary orders reviewed?: No Do you have support at home?: Yes People in Home: child(ren), adult, spouse Name of Support/Comfort Primary Source: [place] and Equipment/Supplies: Were Home Health Services Ordered?: NA Any new equipment or medical supplies ordered?: NA  Functional Questionnaire:    Follow up appointments reviewed:   Patient will call office to schedule folow up Er visit  SDOH Interventions Today    Flowsheet Row Most Recent Value  SDOH Interventions   Transportation Interventions Intervention Not Indicated       SIGNATURE L.Wilson,LPN

## 2022-10-05 ENCOUNTER — Observation Stay (HOSPITAL_COMMUNITY)
Admission: EM | Admit: 2022-10-05 | Discharge: 2022-10-06 | Disposition: A | Discharge status: Home / Self Care | Payer: 59 | Attending: Internal Medicine | Admitting: Internal Medicine

## 2022-10-05 ENCOUNTER — Observation Stay (HOSPITAL_COMMUNITY): Payer: 59

## 2022-10-05 ENCOUNTER — Ambulatory Visit
Admission: RE | Admit: 2022-10-05 | Discharge: 2022-10-05 | Disposition: A | Discharge status: Home / Self Care | Payer: 59 | Source: Ambulatory Visit | Attending: Family | Admitting: Family

## 2022-10-05 ENCOUNTER — Encounter (HOSPITAL_COMMUNITY): Payer: Self-pay

## 2022-10-05 ENCOUNTER — Other Ambulatory Visit: Payer: Self-pay

## 2022-10-05 ENCOUNTER — Emergency Department (HOSPITAL_COMMUNITY): Payer: 59

## 2022-10-05 DIAGNOSIS — K219 Gastro-esophageal reflux disease without esophagitis: Secondary | ICD-10-CM | POA: Diagnosis not present

## 2022-10-05 DIAGNOSIS — I10 Essential (primary) hypertension: Secondary | ICD-10-CM | POA: Insufficient documentation

## 2022-10-05 DIAGNOSIS — E1169 Type 2 diabetes mellitus with other specified complication: Secondary | ICD-10-CM | POA: Diagnosis present

## 2022-10-05 DIAGNOSIS — Z1231 Encounter for screening mammogram for malignant neoplasm of breast: Secondary | ICD-10-CM

## 2022-10-05 DIAGNOSIS — R55 Syncope and collapse: Secondary | ICD-10-CM | POA: Diagnosis present

## 2022-10-05 DIAGNOSIS — K5900 Constipation, unspecified: Secondary | ICD-10-CM

## 2022-10-05 DIAGNOSIS — E119 Type 2 diabetes mellitus without complications: Secondary | ICD-10-CM | POA: Insufficient documentation

## 2022-10-05 DIAGNOSIS — E785 Hyperlipidemia, unspecified: Secondary | ICD-10-CM | POA: Diagnosis present

## 2022-10-05 DIAGNOSIS — I951 Orthostatic hypotension: Secondary | ICD-10-CM

## 2022-10-05 DIAGNOSIS — Z79899 Other long term (current) drug therapy: Secondary | ICD-10-CM | POA: Diagnosis not present

## 2022-10-05 LAB — CBC
HCT: 39 % (ref 36.0–46.0)
Hemoglobin: 12.8 g/dL (ref 12.0–15.0)
MCH: 29.3 pg (ref 26.0–34.0)
MCHC: 32.8 g/dL (ref 30.0–36.0)
MCV: 89.2 fL (ref 80.0–100.0)
Platelets: 227 10*3/uL (ref 150–400)
RBC: 4.37 MIL/uL (ref 3.87–5.11)
RDW: 14.2 % (ref 11.5–15.5)
WBC: 8.3 10*3/uL (ref 4.0–10.5)
nRBC: 0 % (ref 0.0–0.2)

## 2022-10-05 LAB — TROPONIN I (HIGH SENSITIVITY)
Troponin I (High Sensitivity): 3 ng/L (ref <18)
Troponin I (High Sensitivity): 2 ng/L (ref <18)

## 2022-10-05 LAB — TSH: TSH: 1.962 u[IU]/mL (ref 0.350–4.500)

## 2022-10-05 LAB — URINALYSIS, ROUTINE W REFLEX MICROSCOPIC
Bilirubin Urine: NEGATIVE
Glucose, UA: NEGATIVE mg/dL
Hgb urine dipstick: NEGATIVE
Ketones, ur: NEGATIVE mg/dL
Leukocytes,Ua: NEGATIVE
Nitrite: NEGATIVE
Protein, ur: NEGATIVE mg/dL
Specific Gravity, Urine: 1.013 (ref 1.005–1.030)
pH: 5 (ref 5.0–8.0)

## 2022-10-05 LAB — BASIC METABOLIC PANEL
Anion gap: 7 (ref 5–15)
BUN: 31 mg/dL — ABNORMAL HIGH (ref 6–20)
CO2: 27 mmol/L (ref 22–32)
Calcium: 9.4 mg/dL (ref 8.9–10.3)
Chloride: 105 mmol/L (ref 98–111)
Creatinine, Ser: 0.76 mg/dL (ref 0.44–1.00)
GFR, Estimated: >60 mL/min (ref >60)
Glucose, Bld: 133 mg/dL — ABNORMAL HIGH (ref 70–99)
Potassium: 4.4 mmol/L (ref 3.5–5.1)
Sodium: 139 mmol/L (ref 135–145)

## 2022-10-05 LAB — CBG MONITORING, ED
Glucose-Capillary: 123 mg/dL — ABNORMAL HIGH (ref 70–99)
Glucose-Capillary: 116 mg/dL — ABNORMAL HIGH (ref 70–99)

## 2022-10-05 LAB — HEMOGLOBIN A1C
Hgb A1c MFr Bld: 5.5 % (ref 4.8–5.6)
Mean Plasma Glucose: 111.15 mg/dL

## 2022-10-05 MED ORDER — INSULIN ASPART 100 UNIT/ML IJ SOLN: 0.0000 [IU] | Freq: Three times a day (TID) | INTRAMUSCULAR | Status: DC

## 2022-10-05 MED ORDER — POLYETHYLENE GLYCOL 3350 17 G PO PACK: 17.0000 g | PACK | Freq: Every day | ORAL | Status: DC | PRN

## 2022-10-05 MED ORDER — INSULIN ASPART 100 UNIT/ML IJ SOLN: 0.0000 [IU] | Freq: Every day | INTRAMUSCULAR | Status: DC

## 2022-10-05 MED ORDER — SODIUM CHLORIDE 0.9% FLUSH
3.0000 mL | Freq: Two times a day (BID) | INTRAVENOUS | Status: DC
  Administered 2022-10-05 – 2022-10-06 (×2): 3 mL via INTRAVENOUS

## 2022-10-05 NOTE — H&P (Signed)
History and Physical    JOSENID RUBENS NWG:[id number] DOB: Oct 12, 1970 DOA: 10/05/2022  PCP: [name], FNP  Patient coming from: Home  Chief Complaint: Syncope  HPI: Becky Townsend is a 52 y.o. female with medical history significant of type 2 diabetes, right foot Charcot arthropathy, hypertension, hyperlipidemia, anxiety presented to the ED after multiple syncopal events since yesterday.  Blood pressure 153/89 on arrival to the ED, remainder of vital signs stable.  Labs showing WBC 8.3, hemoglobin 12.8, platelet count 227k, sodium 139, potassium 4.4, chloride 105, bicarb 27, BUN 31, creatinine 0.7, glucose 133, troponin negative.  Chest x-ray showing no acute cardiopulmonary disease.  EKG showing normal sinus rhythm and no acute ischemic changes.  TRH called to admit for syncope workup.    Patient states about 3 weeks ago she was talking to her daughter at home when all of a sudden she passed out and was unconscious for about 45 seconds.  Husband states patient's arms were stiff during this episode and when she regained consciousness, she appeared confused and was just staring.  Patient states she was seen in the emergency room at that time and had tests done and was told everything was okay but advised to follow-up with her PCP which she has not done yet.  Yesterday she went to visit her mother for Mother's Day and while sitting and talking to family she passed out again.  She recalls having tunnel vision for few seconds just before passing out.  This episode lasted about 10 seconds and she has continued to feel jittery since then.  Today she was in her bathroom sitting on the toilet with her phone when she again passed out for a few seconds and when she regained consciousness she was leaning against the wall and could not find her phone.  Husband states on the way to the emergency room patient passed out again in the car and regained consciousness within a few seconds and was back to normal.   Patient denies preceding lightheadedness/dizziness, chest pain, shortness of breath, or palpitations.  Denies history of seizures or blood clots.  Denies personal or family history of heart problems.  Denies family history of sudden cardiac death.    Denies fevers, cough, nausea, vomiting, abdominal pain, or diarrhea.  She is constipated.  Review of Systems:  Review of Systems  All other systems reviewed and are negative.   Past Medical History:  Diagnosis Date   Anxiety    History of kidney stones    Medical history non-contributory    PONV (postoperative nausea and vomiting)    even though no surgeries is very prone to nausea and vomiting with many pain medications in the past            Past Surgical History:  Procedure Laterality Date   NO PAST SURGERIES     ORIF HUMERUS FRACTURE Left 06/28/2018   Procedure: OPEN REDUCTION INTERNAL FIXATION (ORIF) HUMERUS FRACTURE;  Surgeon: Yolonda Kida, MD;  Location: [facility] OR;  Service: Orthopedics;  Laterality: Left;  120 mins     reports that she has never smoked. She has never used smokeless tobacco. She reports current alcohol use. She reports that she does not use drugs.  Allergies  Allergen Reactions   Percocet [Oxycodone-Acetaminophen] Nausea And Vomiting   Sudafed [Pseudoephedrine]     Hives    Family History  Problem Relation Age of Onset   Hypertension Father    Breast cancer Neg Hx  Prior to Admission medications   Medication Sig Start Date End Date Taking? Authorizing Provider  blood glucose meter kit and supplies Dispense based on patient and insurance preference. Use up to four times daily as directed. (FOR ICD-10 E10.9, E11.9). 04/06/22   [name], FNP  blood glucose meter kit and supplies E11 whatever insurance will pay for 04/07/22   Jannifer Rodney A, FNP  Blood Glucose Monitoring Suppl (ONE TOUCH ULTRA 2) w/Device KIT Use up to four times daily Dx E11.69 04/06/22   [name], FNP  glucose  blood (ONETOUCH ULTRA) test strip Test BS daily Dx E11.69 04/09/22   [name], FNP  Lancets [facility] ULTRASOFT) lancets Test BS daily Dx E11.69 04/09/22   Jannifer Rodney A, FNP  lisinopril (ZESTRIL) 5 MG tablet Take 1 tablet (5 mg total) by mouth daily. 04/07/22   [name], FNP  rosuvastatin (CRESTOR) 5 MG tablet Take 1 tablet (5 mg total) by mouth daily. 04/07/22   [name], FNP  UNABLE TO FIND Med Name: eye injection eylea    [provider]    Physical Exam: Vitals:   10/05/22 1835 10/05/22 2040 10/05/22 2100 10/05/22 2130  BP: (!) 176/103 (!) 150/84 129/82 119/77  Pulse: 95 89 87 89  Resp: 16 10 15 14   Temp:      TempSrc:      SpO2: 99% 99% 98% 97%    Physical Exam Vitals reviewed.  Constitutional:      General: She is not in acute distress. HENT:     Head: Normocephalic and atraumatic.  Eyes:     Extraocular Movements: Extraocular movements intact.  Cardiovascular:     Rate and Rhythm: Normal rate and regular rhythm.     Pulses: Normal pulses.     Heart sounds: Murmur heard.     Comments: Grade 2/6 murmur appreciated in the aortic region Pulmonary:     Effort: Pulmonary effort is normal. No respiratory distress.     Breath sounds: Normal breath sounds. No wheezing or rales.  Abdominal:     General: Bowel sounds are normal. There is no distension.     Palpations: Abdomen is soft.     Tenderness: There is no abdominal tenderness.  Musculoskeletal:     Cervical back: Normal range of motion.     Right lower leg: No edema.     Left lower leg: No edema.  Skin:    General: Skin is warm and dry.  Neurological:     General: No focal deficit present.     Mental Status: She is alert and oriented to person, place, and time.     Labs on Admission: I have personally reviewed following labs and imaging studies  CBC: Recent Labs  Lab 10/05/22 1746  WBC 8.3  HGB 12.8  HCT 39.0  MCV 89.2  PLT 227   Basic Metabolic Panel: Recent Labs   Lab 10/05/22 1746  NA 139  K 4.4  CL 105  CO2 27  GLUCOSE 133*  BUN 31*  CREATININE 0.76  CALCIUM 9.4   GFR: CrCl cannot be calculated (Unknown ideal weight.). Liver Function Tests: No results for input(s): "AST", "ALT", "ALKPHOS", "BILITOT", "PROT", "ALBUMIN" in the last 168 hours. No results for input(s): "LIPASE", "AMYLASE" in the last 168 hours. No results for input(s): "AMMONIA" in the last 168 hours. Coagulation Profile: No results for input(s): "INR", "PROTIME" in the last 168 hours. Cardiac Enzymes: No results for input(s): "CKTOTAL", "CKMB", "CKMBINDEX", "  TROPONINI" in the last 168 hours. BNP (last 3 results) No results for input(s): "PROBNP" in the last 8760 hours. HbA1C: No results for input(s): "HGBA1C" in the last 72 hours. CBG: Recent Labs  Lab 10/05/22 1755  GLUCAP 123*   Lipid Profile: No results for input(s): "CHOL", "HDL", "LDLCALC", "TRIG", "CHOLHDL", "LDLDIRECT" in the last 72 hours. Thyroid Function Tests: Recent Labs    10/05/22 2034  TSH 1.962   Anemia Panel: No results for input(s): "VITAMINB12", "FOLATE", "FERRITIN", "TIBC", "IRON", "RETICCTPCT" in the last 72 hours. Urine analysis:    Component Value Date/Time   COLORURINE YELLOW 10/05/2022 1810   APPEARANCEUR CLEAR 10/05/2022 1810   LABSPEC 1.013 10/05/2022 1810   PHURINE 5.0 10/05/2022 1810   GLUCOSEU NEGATIVE 10/05/2022 1810   HGBUR NEGATIVE 10/05/2022 1810   BILIRUBINUR NEGATIVE 10/05/2022 1810   KETONESUR NEGATIVE 10/05/2022 1810   PROTEINUR NEGATIVE 10/05/2022 1810   NITRITE NEGATIVE 10/05/2022 1810   LEUKOCYTESUR NEGATIVE 10/05/2022 1810    Radiological Exams on Admission: DG Chest 2 View  Result Date: 10/05/2022 CLINICAL DATA:  Syncope. EXAM: CHEST - 2 VIEW COMPARISON:  None Available. FINDINGS: Clear lungs. Normal heart size and mediastinal contours. No pleural effusion or pneumothorax. Prior left humeral shaft ORIF. Visualized upper abdomen is unremarkable. IMPRESSION:  No evidence of acute cardiopulmonary disease. Electronically Signed   By: Orvan Falconer M.D.   On: 10/05/2022 19:35    Assessment and Plan  Recurrent syncope ?Cardiac etiology (valvular issue versus arrhythmia).  Grade 2/6 murmur appreciated in the aortic region on exam.  ?Vasovagal versus orthostatic.  EKG showing normal sinus rhythm and no acute ischemic changes.  Troponin negative.  Chest x-ray showing no acute cardiopulmonary disease.  PE less likely given no tachycardia, dyspnea, hypoxia, or chest pain. ?Seizures.   -Plan is to continue cardiac monitoring.  Hold antihypertensives at this time and check orthostatics, fall precautions.  Stat CT head ordered.  Echocardiogram and EEG ordered.  UDS ordered.  Type 2 diabetes A1c 10.9 in February 2023.  Repeat A1c ordered.  Sensitive sliding scale insulin ACHS.  Hypertension Hold lisinopril at this time, checking orthostatics.  Hyperlipidemia Continue statin after pharmacy med rec is done.  Constipation No nausea, vomiting, or abdominal pain.  Abdominal exam benign.  MiraLAX as needed.  DVT prophylaxis: SCDs Code Status: Full Code (discussed with the patient) Family Communication: Husband at bedside. Level of care: Telemetry bed Admission status: It is my clinical opinion that referral for OBSERVATION is reasonable and necessary in this patient based on the above information provided. The aforementioned taken together are felt to place the patient at high risk for further clinical deterioration. However, it is anticipated that the patient may be medically stable for discharge from the hospital within 24 to 48 hours.  John Giovanni MD [facility]  If 7PM-7AM, please contact night-coverage [email or web address]  10/05/2022, 9:56 PM

## 2022-10-05 NOTE — ED Provider Notes (Signed)
[place] EMERGENCY DEPARTMENT AT [facility] Provider Note   CSN: [id number] Arrival date & time: 10/05/22  1705     History  No chief complaint on file.   Becky Townsend is a 52 y.o. female.  HPI 52 year old female with history of diabetes controlled by diet presenting for syncope.  Patient states 3 to 4 weeks ago she was at home when her face became tingly, she got tunnel vision and then passed out for about 45 seconds.  No chest pain, difficulty breathing, palpitations.  She was seen in the ED at the time.  She had a CT head and lab work which were unremarkable.  She was discharged home, she had faint murmur noted at the time.  She did not follow-up with her PCP.  Yesterday she was sitting at the table when she again had facial numbness, tunnel vision and then passed out for 15 seconds.  No other symptoms and no shaking or postictal period.  No history of seizures.  Today once in the bathroom once in the car on the way here she again had syncopal episode similar to this.  She is not had any chest pain or Diffley breathing, vomiting, abdominal pain, fever, chills, shortness of breath.  She has history of right-sided Charcot foot, but no new swelling and no history of DVT or PE.  Currently she is asymptomatic.     Home Medications Prior to Admission medications   Medication Sig Start Date End Date Taking? Authorizing Provider  lisinopril (ZESTRIL) 5 MG tablet Take 1 tablet (5 mg total) by mouth daily. 04/07/22  Yes Hawks, Christy A, FNP  rosuvastatin (CRESTOR) 5 MG tablet Take 1 tablet (5 mg total) by mouth daily. 04/07/22  Yes Hawks, Edilia Bo, FNP  UNABLE TO FIND Med Name: Eylea (aflibercept) eye injection   Yes [provider]  blood glucose meter kit and supplies Dispense based on patient and insurance preference. Use up to four times daily as directed. (FOR ICD-10 E10.9, E11.9). 04/06/22   [name], FNP  blood glucose meter kit and supplies E11 whatever  insurance will pay for 04/07/22   [name], FNP  Blood Glucose Monitoring Suppl (ONE TOUCH ULTRA 2) w/Device KIT Use up to four times daily Dx E11.69 04/06/22   [name], FNP  glucose blood (ONETOUCH ULTRA) test strip Test BS daily Dx E11.69 04/09/22   [name], FNP  Lancets [facility] ULTRASOFT) lancets Test BS daily Dx E11.69 04/09/22   [name], FNP      Allergies    Percocet [oxycodone-acetaminophen] and Sudafed [pseudoephedrine]    Review of Systems   Review of Systems  Neurological:  Positive for syncope.  All other systems reviewed and are negative.   Physical Exam Updated Vital Signs BP 119/77   Pulse (!) 101   Temp 97.9 F (36.6 C) (Oral)   Resp (!) 23   SpO2 99%  Physical Exam Vitals and nursing note reviewed.  Constitutional:      General: She is not in acute distress.    Appearance: She is well-developed.  HENT:     Head: Normocephalic and atraumatic.     Nose: Nose normal.     Mouth/Throat:     Mouth: Mucous membranes are moist.     Pharynx: Oropharynx is clear.  Eyes:     Conjunctiva/sclera: Conjunctivae normal.  Cardiovascular:     Rate and Rhythm: Normal rate and regular rhythm.  Heart sounds: No murmur heard. Pulmonary:     Effort: Pulmonary effort is normal. No respiratory distress.     Breath sounds: Normal breath sounds.  Abdominal:     Palpations: Abdomen is soft.     Tenderness: There is no abdominal tenderness. There is no guarding or rebound.  Musculoskeletal:        General: No swelling.     Cervical back: Neck supple.     Right lower leg: No edema.     Left lower leg: No edema.  Skin:    General: Skin is warm and dry.     Capillary Refill: Capillary refill takes less than 2 seconds.  Neurological:     General: No focal deficit present.     Mental Status: She is alert and oriented to person, place, and time. Mental status is at baseline.     Cranial Nerves: No cranial nerve deficit.     Sensory: No  sensory deficit.     Motor: No weakness.     Coordination: Coordination normal.     Gait: Gait normal.     Deep Tendon Reflexes: Reflexes normal.  Psychiatric:        Mood and Affect: Mood normal.     ED Results / Procedures / Treatments   Labs (all labs ordered are listed, but only abnormal results are displayed) Labs Reviewed  BASIC METABOLIC PANEL - Abnormal; Notable for the following components:      Result Value   Glucose, Bld 133 (*)    BUN 31 (*)    All other components within normal limits  CBG MONITORING, ED - Abnormal; Notable for the following components:   Glucose-Capillary 123 (*)    All other components within normal limits  CBG MONITORING, ED - Abnormal; Notable for the following components:   Glucose-Capillary 116 (*)    All other components within normal limits  CBC  URINALYSIS, ROUTINE W REFLEX MICROSCOPIC  TSH  HEMOGLOBIN A1C  HIV ANTIBODY (ROUTINE TESTING W REFLEX)  RAPID URINE DRUG SCREEN, HOSP PERFORMED  TROPONIN I (HIGH SENSITIVITY)  TROPONIN I (HIGH SENSITIVITY)    EKG None  Radiology DG Chest 2 View  Result Date: 10/05/2022 CLINICAL DATA:  Syncope. EXAM: CHEST - 2 VIEW COMPARISON:  None Available. FINDINGS: Clear lungs. Normal heart size and mediastinal contours. No pleural effusion or pneumothorax. Prior left humeral shaft ORIF. Visualized upper abdomen is unremarkable. IMPRESSION: No evidence of acute cardiopulmonary disease. Electronically Signed   By: Orvan Falconer M.D.   On: 10/05/2022 19:35    Procedures Procedures    Medications Ordered in ED Medications  sodium chloride flush (NS) 0.9 % injection 3 mL (3 mLs Intravenous Given 10/05/22 2237)  polyethylene glycol (MIRALAX / GLYCOLAX) packet 17 g (has no administration in time range)  insulin aspart (novoLOG) injection 0-9 Units (has no administration in time range)  insulin aspart (novoLOG) injection 0-5 Units ( Subcutaneous Not Given 10/05/22 2237)    ED Course/ Medical Decision  Making/ A&P Clinical Course as of 10/05/22 2301  Mon Oct 05, 2022  2043 EKG normal sinus rhythm, normal intervals, no acute ST or T wave normality.  No signs of ischemia or arrhythmia. [JD]    Clinical Course User Index [JD] Fulton Reek, MD                             Medical Decision Making Amount and/or Complexity of Data Reviewed Labs: ordered. Radiology:  ordered.  Risk Decision regarding hospitalization.   52 year old female presenting for recurrent syncope.  Vital signs reviewed notable for mild hypertension.  On exam she is currently asymptomatic.  However history is concerning for recurrent syncope with brief prodromal symptoms per differential including arrhythmia, ischemia, vasovagal although less likely given no precipitating symptoms.  Her neurologic exam is normal no associated neurologic symptoms, low concern for CVA.  No infectious symptoms either.  I do not hear a murmur here although was noted on recent ED visit, consider possible valvular pathology as well.  BMP notable for BUN of 31.  CBC is unremarkable.  Urinalysis is unremarkable as well.  Troponin negative, TSH is normal.  Chest x-ray reviewed, no signs of pneumonia, pneumothorax, pulmonary edema, mediastinal widening.  She has not had any additional episodes in the ED.  However given her recurrent syncope which is becoming more often, I think she needs admission for further workup and telemetry.  I discussed the patient with the hospitalist service and she was admitted for further management.        Final Clinical Impression(s) / ED Diagnoses Final diagnoses:  Syncope, unspecified syncope type    Rx / DC Orders ED Discharge Orders     None         Fulton Reek, MD 10/05/22 4098    Glendora Score, MD 10/06/22 1540

## 2022-10-05 NOTE — ED Triage Notes (Signed)
Patient had recent syncopal event and had full work up and did not follow up with primary. Patient had another syncopal event yesterday and x 3 today. No injury. On assessment alert and oriented

## 2022-10-05 NOTE — Progress Notes (Signed)
EEG complete - results pending 

## 2022-10-06 ENCOUNTER — Observation Stay (HOSPITAL_BASED_OUTPATIENT_CLINIC_OR_DEPARTMENT_OTHER): Payer: 59

## 2022-10-06 ENCOUNTER — Observation Stay (HOSPITAL_COMMUNITY): Payer: 59

## 2022-10-06 DIAGNOSIS — R569 Unspecified convulsions: Secondary | ICD-10-CM | POA: Diagnosis not present

## 2022-10-06 DIAGNOSIS — I951 Orthostatic hypotension: Secondary | ICD-10-CM

## 2022-10-06 DIAGNOSIS — R55 Syncope and collapse: Secondary | ICD-10-CM

## 2022-10-06 DIAGNOSIS — K5900 Constipation, unspecified: Secondary | ICD-10-CM

## 2022-10-06 LAB — ECHOCARDIOGRAM COMPLETE
Area-P 1/2: 5.32 cm2
Calc EF: 71.2 %
Height: 61 in
S' Lateral: 2.65 cm
Single Plane A2C EF: 69.7 %
Single Plane A4C EF: 72.6 %
Weight: 2649.05 oz

## 2022-10-06 LAB — GLUCOSE, CAPILLARY
Glucose-Capillary: 98 mg/dL (ref 70–99)
Glucose-Capillary: 136 mg/dL — ABNORMAL HIGH (ref 70–99)

## 2022-10-06 LAB — HIV ANTIBODY (ROUTINE TESTING W REFLEX): HIV Screen 4th Generation wRfx: NONREACTIVE

## 2022-10-06 MED ORDER — GADOBUTROL 1 MMOL/ML IV SOLN
7.0000 mL | Freq: Once | INTRAVENOUS | Status: AC | PRN
  Administered 2022-10-06: 7 mL via INTRAVENOUS

## 2022-10-06 NOTE — ED Notes (Signed)
ED TO INPATIENT HANDOFF REPORT  ED Nurse Name and Phone #: Alycia Rossetti [phone]  S Name/Age/Gender Becky Townsend 52 y.o. female Room/Bed: 039C/039C  Code Status   Code Status: Full Code  Home/SNF/Other Home Patient oriented to: self, place, time, and situation Is this baseline? Yes   Triage Complete: Triage complete  Chief Complaint Recurrent syncope [R55]  Triage Note Patient had recent syncopal event and had full work up and did not follow up with primary. Patient had another syncopal event yesterday and x 3 today. No injury. On assessment alert and oriented   Allergies Allergies  Allergen Reactions   Percocet [Oxycodone-Acetaminophen] Nausea And Vomiting   Sudafed [Pseudoephedrine]     Hives    Level of Care/Admitting Diagnosis ED Disposition     ED Disposition  Admit   Condition  --   Comment  Hospital Area: [facility] [[id number]]  Level of Care: Telemetry Cardiac [[id number]]  May place patient in observation at [facility] or [facility] if equivalent level of care is available:: Yes  Covid Evaluation: Asymptomatic - no recent exposure (last 10 days) testing not required  Diagnosis: Recurrent syncope [[id number]]  Admitting Physician: John Giovanni [[id number]]  Attending Physician: John Giovanni [[id number]]          B Medical/Surgery History Past Medical History:  Diagnosis Date   Anxiety    History of kidney stones    Medical history non-contributory    PONV (postoperative nausea and vomiting)    even though no surgeries is very prone to nausea and vomiting with many pain medications in the past           Past Surgical History:  Procedure Laterality Date   NO PAST SURGERIES     ORIF HUMERUS FRACTURE Left 06/28/2018   Procedure: OPEN REDUCTION INTERNAL FIXATION (ORIF) HUMERUS FRACTURE;  Surgeon: Yolonda Kida, MD;  Location: [facility] OR;  Service: Orthopedics;  Laterality: Left;  120 mins     A IV Location/Drains/Wounds Patient  Lines/Drains/Airways Status     Active Line/Drains/Airways     Name Placement date Placement time Site Days   Peripheral IV 10/05/22 20 G Left Antecubital 10/05/22  2033  Antecubital  1            Intake/Output Last 24 hours No intake or output data in the 24 hours ending 10/06/22 0226  Labs/Imaging Results for orders placed or performed during the hospital encounter of 10/05/22 (from the past 48 hour(s))  Basic metabolic panel     Status: Abnormal   Collection Time: 10/05/22  5:46 PM  Result Value Ref Range   Sodium 139 135 - 145 mmol/L   Potassium 4.4 3.5 - 5.1 mmol/L   Chloride 105 98 - 111 mmol/L   CO2 27 22 - 32 mmol/L   Glucose, Bld 133 (H) 70 - 99 mg/dL    Comment: Glucose reference range applies only to samples taken after fasting for at least 8 hours.   BUN 31 (H) 6 - 20 mg/dL   Creatinine, Ser 8.65 0.44 - 1.00 mg/dL   Calcium 9.4 8.9 - 78.4 mg/dL   GFR, Estimated >69 >62 mL/min    Comment: (NOTE) Calculated using the CKD-EPI Creatinine Equation (2021)    Anion gap 7 5 - 15    Comment: Performed at [facility], [address] [place], [place] [address]  CBC     Status: None   Collection Time: 10/05/22  5:46 PM  Result Value Ref  Range   WBC 8.3 4.0 - 10.5 K/uL   RBC 4.37 3.87 - 5.11 MIL/uL   Hemoglobin 12.8 12.0 - 15.0 g/dL   HCT 84.1 32.4 - 40.1 %   MCV 89.2 80.0 - 100.0 fL   MCH 29.3 26.0 - 34.0 pg   MCHC 32.8 30.0 - 36.0 g/dL   RDW 02.7 25.3 - 66.4 %   Platelets 227 150 - 400 K/uL   nRBC 0.0 0.0 - 0.2 %    Comment: Performed at [facility], [address], [place], [place] [address]  CBG monitoring, ED     Status: Abnormal   Collection Time: 10/05/22  5:55 PM  Result Value Ref Range   Glucose-Capillary 123 (H) 70 - 99 mg/dL    Comment: Glucose reference range applies only to samples taken after fasting for at least 8 hours.  Urinalysis, Routine w reflex microscopic -Urine, Clean Catch     Status: None   Collection Time: 10/05/22  6:10  PM  Result Value Ref Range   Color, Urine YELLOW YELLOW   APPearance CLEAR CLEAR   Specific Gravity, Urine 1.013 1.005 - 1.030   pH 5.0 5.0 - 8.0   Glucose, UA NEGATIVE NEGATIVE mg/dL   Hgb urine dipstick NEGATIVE NEGATIVE   Bilirubin Urine NEGATIVE NEGATIVE   Ketones, ur NEGATIVE NEGATIVE mg/dL   Protein, ur NEGATIVE NEGATIVE mg/dL   Nitrite NEGATIVE NEGATIVE   Leukocytes,Ua NEGATIVE NEGATIVE    Comment: Performed at [facility], [address] [place], [place] [address]  TSH     Status: None   Collection Time: 10/05/22  8:34 PM  Result Value Ref Range   TSH 1.962 0.350 - 4.500 uIU/mL    Comment: Performed by a 3rd Generation assay with a functional sensitivity of <=0.01 uIU/mL. Performed at [facility], [address], [place], [place] [address]   Troponin I (High Sensitivity)     Status: None   Collection Time: 10/05/22  8:34 PM  Result Value Ref Range   Troponin I (High Sensitivity) 3 <18 ng/L    Comment: (NOTE) Elevated high sensitivity troponin I (hsTnI) values and significant  changes across serial measurements may suggest ACS but many other  chronic and acute conditions are known to elevate hsTnI results.  Refer to the "Links" section for chest pain algorithms and additional  guidance. Performed at [facility], [address] [place], [place] [address]   Troponin I (High Sensitivity)     Status: None   Collection Time: 10/05/22 10:09 PM  Result Value Ref Range   Troponin I (High Sensitivity) 2 <18 ng/L    Comment: (NOTE) Elevated high sensitivity troponin I (hsTnI) values and significant  changes across serial measurements may suggest ACS but many other  chronic and acute conditions are known to elevate hsTnI results.  Refer to the "Links" section for chest pain algorithms and additional  guidance. Performed at [facility], [address], [place], [place] [address]   CBG monitoring, ED     Status: Abnormal   Collection Time:  10/05/22 10:33 PM  Result Value Ref Range   Glucose-Capillary 116 (H) 70 - 99 mg/dL    Comment: Glucose reference range applies only to samples taken after fasting for at least 8 hours.  HIV Antibody (routine testing w rflx)     Status: None   Collection Time: 10/05/22 10:34 PM  Result Value Ref Range   HIV Screen 4th  Generation wRfx Non Reactive Non Reactive    Comment: Performed at [facility], [address], [place], [place] [address]  Hemoglobin A1c     Status: None   Collection Time: 10/05/22 10:34 PM  Result Value Ref Range   Hgb A1c MFr Bld 5.5 4.8 - 5.6 %    Comment: (NOTE) Pre diabetes:          5.7%-6.4%  Diabetes:              >6.4%  Glycemic control for   <7.0% adults with diabetes    Mean Plasma Glucose 111.15 mg/dL    Comment: Performed at [facility], [address] [place], [place] [address]   CT HEAD WO CONTRAST ( )  Result Date: 10/05/2022 CLINICAL DATA:  Syncope. EXAM: CT HEAD WITHOUT CONTRAST TECHNIQUE: Contiguous axial images were obtained from the base of the skull through the vertex without intravenous contrast. RADIATION DOSE REDUCTION: This exam was performed according to the departmental dose-optimization program which includes automated exposure control, adjustment of the mA and/or kV according to patient size and/or use of iterative reconstruction technique. COMPARISON:  September 11, 2022 FINDINGS: Brain: No evidence of acute infarction, hemorrhage, hydrocephalus, extra-axial collection or mass lesion/mass effect. Vascular: No hyperdense vessel or unexpected calcification. Skull: Normal. Negative for fracture or focal lesion. Sinuses/Orbits: No acute finding. Other: None. IMPRESSION: No acute intracranial pathology. Electronically Signed   By: Aram Candela M.D.   On: 10/05/2022 23:00   DG Chest 2 View  Result Date: 10/05/2022 CLINICAL DATA:  Syncope. EXAM: CHEST - 2 VIEW COMPARISON:  None Available. FINDINGS: Clear lungs. Normal heart size  and mediastinal contours. No pleural effusion or pneumothorax. Prior left humeral shaft ORIF. Visualized upper abdomen is unremarkable. IMPRESSION: No evidence of acute cardiopulmonary disease. Electronically Signed   By: Orvan Falconer M.D.   On: 10/05/2022 19:35    Pending Labs Unresulted Labs (From admission, onward)     Start     Ordered   10/05/22 2222  Rapid urine drug screen (hospital performed)  ONCE - STAT,   STAT        10/05/22 2222            Vitals/Pain Today's Vitals   10/05/22 2100 10/05/22 2130 10/05/22 2145 10/05/22 2210  BP: 129/82 119/77    Pulse: 87 89 (!) 101   Resp: 15 14 (!) 23   Temp:    97.9 F (36.6 C)  TempSrc:    Oral  SpO2: 98% 97% 99%   PainSc:        Isolation Precautions No active isolations  Medications Medications  sodium chloride flush (NS) 0.9 % injection 3 mL (3 mLs Intravenous Given 10/05/22 2237)  polyethylene glycol (MIRALAX / GLYCOLAX) packet 17 g (has no administration in time range)  insulin aspart (novoLOG) injection 0-9 Units (has no administration in time range)  insulin aspart (novoLOG) injection 0-5 Units ( Subcutaneous Not Given 10/05/22 2237)    Mobility walks     Focused Assessments    R Recommendations: See Admitting Provider Note  Report given to:   Additional Notes: Patient is here for observation, patient is super pleasant, alert, oriented, ambulatory. No distress. Keeps having moments of light headedness, syncope, and weakness.

## 2022-10-06 NOTE — Hospital Course (Signed)
Ms. Romans is a 52 yo female with PMH DMII, right foot Charcot arthropathy, HTN, HLD, anxiety who presented to the ER after multiple syncopal episodes. She had recently been seen in the ER on 09/11/2022 after a syncopal episode at that time and was discharged home after negative workup.  She again presented back with similar episodes.  She was standing in the kitchen during one of the episodes and sitting on a stool, and also in the bathroom on the commode for one of them.  Most episodes seem to last around 10 to 15 seconds then she spontaneously regained consciousness and has no postictal phase noted per husband.  No seizure activity reported either.  She did have some prodrome symptoms for one of her episodes when she felt her face tingling and had tunnel vision when she was riding in the car while her husband was driving.  She was out again for about 10 seconds and then spontaneously regained consciousness with no lingering symptoms.  She was admitted for further workup given recurrent syncope.  EEG was obtained which was unremarkable.  CT head was negative for acute abnormalities.  MRI brain with/without contrast was obtained and also negative for any abnormalities (mucosal thickening bilateral ethmoid sinuses otherwise unremarkable). Echo also obtained which was normal.  Orthostatics were also obtained on admission and notably positive: 134/79 >> 122/78 >> 96/73. Orthostatics were again repeated the morning following admission and also again positive: 112/81 >> 105/72 >> 98/65.  She was taken off of her lisinopril and recommended to continue monitoring blood pressure and symptoms outpatient.  If recurrent syncope after this, further consideration should be given for possible autonomic dysfunction.

## 2022-10-06 NOTE — Discharge Summary (Signed)
Physician Discharge Summary   [name]:[id number] DOB: 11-Jun-1970 DOA: 10/05/2022  PCP: [name], FNP  Admit date: 10/05/2022 Discharge date: 10/06/2022   Admitted From: Home Disposition:  Home Discharging physician: Lewie Chamber, MD Barriers to discharge: none  Recommendations at discharge: Continue chronic management    Discharge Condition: stable CODE STATUS: Full Diet recommendation:  Diet Orders (From admission, onward)     Start     Ordered   10/05/22 2220  Diet heart healthy/carb modified Room service appropriate? Yes; Fluid consistency: Thin  Diet effective now       Question Answer Comment  Diet-HS Snack? Nothing   Room service appropriate? Yes   Fluid consistency: Thin      10/05/22 2222            Hospital Course: Becky Townsend is a 52 yo female with PMH DMII, right foot Charcot arthropathy, HTN, HLD, anxiety who presented to the ER after multiple syncopal episodes. She had recently been seen in the ER on 09/11/2022 after a syncopal episode at that time and was discharged home after negative workup.  She again presented back with similar episodes.  She was standing in the kitchen during one of the episodes and sitting on a stool, and also in the bathroom on the commode for one of them.  Most episodes seem to last around 10 to 15 seconds then she spontaneously regained consciousness and has no postictal phase noted per husband.  No seizure activity reported either.  She did have some prodrome symptoms for one of her episodes when she felt her face tingling and had tunnel vision when she was riding in the car while her husband was driving.  She was out again for about 10 seconds and then spontaneously regained consciousness with no lingering symptoms.  She was admitted for further workup given recurrent syncope.  EEG was obtained which was unremarkable.  CT head was negative for acute abnormalities.  MRI brain with/without contrast was obtained and  also negative for any abnormalities (mucosal thickening bilateral ethmoid sinuses otherwise unremarkable). Echo also obtained which was normal.  Orthostatics were also obtained on admission and notably positive: 134/79 >> 122/78 >> 96/73. Orthostatics were again repeated the morning following admission and also again positive: 112/81 >> 105/72 >> 98/65.  She was taken off of her lisinopril and recommended to continue monitoring blood pressure and symptoms outpatient.  If recurrent syncope after this, further consideration should be given for possible autonomic dysfunction.   The patient's chronic medical conditions were treated accordingly per the patient's home medication regimen except as noted.  On day of discharge, patient was felt deemed stable for discharge. Patient/family member advised to call PCP or come back to ER if needed.   Principal Diagnosis: Recurrent syncope  Discharge Diagnoses: Active Hospital Problems   Diagnosis Date Noted   Orthostatic hypotension 10/06/2022    Priority: 1.   HTN (hypertension) 10/05/2022   Constipation 10/05/2022   Hyperlipidemia associated with type 2 diabetes mellitus (HCC) 04/07/2022    Resolved Hospital Problems   Diagnosis Date Noted Date Resolved   Recurrent syncope 10/05/2022 10/06/2022    Priority: 1.     Discharge Instructions     Increase activity slowly   Complete by: As directed       Allergies as of 10/06/2022       Reactions   Percocet [oxycodone-acetaminophen] Nausea And Vomiting   Sudafed [pseudoephedrine]    Hives  Medication List     STOP taking these medications    lisinopril 5 MG tablet Commonly known as: ZESTRIL       TAKE these medications    blood glucose meter kit and supplies Dispense based on patient and insurance preference. Use up to four times daily as directed. (FOR ICD-10 E10.9, E11.9).   blood glucose meter kit and supplies E11 whatever insurance will pay for   ONE TOUCH ULTRA  2 w/Device Kit Use up to four times daily Dx E11.69   OneTouch Ultra test strip Generic drug: glucose blood Test BS daily Dx E11.69   onetouch ultrasoft lancets Test BS daily Dx E11.69   rosuvastatin 5 MG tablet Commonly known as: Crestor Take 1 tablet (5 mg total) by mouth daily.   UNABLE TO FIND Med Name: Eylea (aflibercept) eye injection        Follow-up Information     [name], FNP. Schedule an appointment as soon as possible for a visit in 2 week(s).   Specialty: Family Medicine Contact information: [address] [place] [address] [phone]                Allergies  Allergen Reactions   Percocet [Oxycodone-Acetaminophen] Nausea And Vomiting   Sudafed [Pseudoephedrine]     Hives    Consultations:   Procedures:   Discharge Exam: BP 98/65   Pulse 92   Temp 97.8 F (36.6 C) (Oral)   Resp 18   Ht 5\' 1"  (1.549 m)   Wt 75.1 kg   SpO2 96%   BMI 31.28 kg/m  Physical Exam Constitutional:      Appearance: Normal appearance.  HENT:     Head: Normocephalic and atraumatic.     Mouth/Throat:     Mouth: Mucous membranes are moist.  Eyes:     Extraocular Movements: Extraocular movements intact.  Cardiovascular:     Rate and Rhythm: Normal rate and regular rhythm.  Pulmonary:     Effort: Pulmonary effort is normal.     Breath sounds: Normal breath sounds.  Abdominal:     General: Bowel sounds are normal. There is no distension.     Palpations: Abdomen is soft.     Tenderness: There is no abdominal tenderness.  Musculoskeletal:        General: Normal range of motion.     Cervical back: Normal range of motion and neck supple.  Skin:    General: Skin is warm and dry.  Neurological:     General: No focal deficit present.     Mental Status: She is alert.  Psychiatric:        Mood and Affect: Mood normal.        Behavior: Behavior normal.      The results of significant diagnostics from this hospitalization (including  imaging, microbiology, ancillary and laboratory) are listed below for reference.   Microbiology: No results found for this or any previous visit (from the past 240 hour(s)).   Labs: BNP (last 3 results) No results for input(s): "BNP" in the last 8760 hours. Basic Metabolic Panel: Recent Labs  Lab 10/05/22 1746  NA 139  K 4.4  CL 105  CO2 27  GLUCOSE 133*  BUN 31*  CREATININE 0.76  CALCIUM 9.4   Liver Function Tests: No results for input(s): "AST", "ALT", "ALKPHOS", "BILITOT", "PROT", "ALBUMIN" in the last 168 hours. No results for input(s): "LIPASE", "AMYLASE" in the last 168 hours. No results for input(s): "AMMONIA" in the  last 168 hours. CBC: Recent Labs  Lab 10/05/22 1746  WBC 8.3  HGB 12.8  HCT 39.0  MCV 89.2  PLT 227   Cardiac Enzymes: No results for input(s): "CKTOTAL", "CKMB", "CKMBINDEX", "TROPONINI" in the last 168 hours. BNP: Invalid input(s): "POCBNP" CBG: Recent Labs  Lab 10/05/22 1755 10/05/22 2233 10/06/22 0630 10/06/22 1212  GLUCAP 123* 116* 98 136*   D-Dimer No results for input(s): "DDIMER" in the last 72 hours. Hgb A1c Recent Labs    10/05/22 2234  HGBA1C 5.5   Lipid Profile No results for input(s): "CHOL", "HDL", "LDLCALC", "TRIG", "CHOLHDL", "LDLDIRECT" in the last 72 hours. Thyroid function studies Recent Labs    10/05/22 2034  TSH 1.962   Anemia work up No results for input(s): "VITAMINB12", "FOLATE", "FERRITIN", "TIBC", "IRON", "RETICCTPCT" in the last 72 hours. Urinalysis    Component Value Date/Time   COLORURINE YELLOW 10/05/2022 1810   APPEARANCEUR CLEAR 10/05/2022 1810   LABSPEC 1.013 10/05/2022 1810   PHURINE 5.0 10/05/2022 1810   GLUCOSEU NEGATIVE 10/05/2022 1810   HGBUR NEGATIVE 10/05/2022 1810   BILIRUBINUR NEGATIVE 10/05/2022 1810   KETONESUR NEGATIVE 10/05/2022 1810   PROTEINUR NEGATIVE 10/05/2022 1810   NITRITE NEGATIVE 10/05/2022 1810   LEUKOCYTESUR NEGATIVE 10/05/2022 1810   Sepsis Labs Recent Labs   Lab 10/05/22 1746  WBC 8.3   Microbiology No results found for this or any previous visit (from the past 240 hour(s)).  Procedures/Studies: ECHOCARDIOGRAM COMPLETE  Result Date: 10/06/2022    ECHOCARDIOGRAM REPORT   Patient Name:   VESENIA PEARMAN Date of Exam: 10/06/2022 Medical Rec #:  [id number]       Height:       61.0 in Accession #:    [id number]      Weight:       165.6 lb Date of Birth:  1970/12/23      BSA:          1.743 m Patient Age:    51 years        BP:           135/81 mmHg Patient Gender: F               HR:           102 bpm. Exam Location:  Inpatient Procedure: 2D Echo, Cardiac Doppler and Color Doppler Indications:    R55 Syncope  History:        Patient has no prior history of Echocardiogram examinations.                 Signs/Symptoms:Syncope; Risk Factors:Hypertension, Diabetes and                 Dyslipidemia.  Sonographer:    Sheralyn Boatman RDCS Referring Phys: John Giovanni IMPRESSIONS  1. Left ventricular ejection fraction, by estimation, is 60 to 65%. The left ventricle has normal function. The left ventricle has no regional wall motion abnormalities. Left ventricular diastolic parameters are indeterminate.  2. Right ventricular systolic function is normal. The right ventricular size is normal.  3. The mitral valve is normal in structure. Trivial mitral valve regurgitation. No evidence of mitral stenosis.  4. The aortic valve is tricuspid. Aortic valve regurgitation is not visualized. No aortic stenosis is present.  5. The inferior vena cava is normal in size with greater than 50% respiratory variability, suggesting right atrial pressure of 3 mmHg. FINDINGS  Left Ventricle: Left ventricular ejection fraction, by estimation, is 60 to 65%. The left ventricle  has normal function. The left ventricle has no regional wall motion abnormalities. The left ventricular internal cavity size was normal in size. There is  no left ventricular hypertrophy. Left ventricular diastolic parameters  are indeterminate. Right Ventricle: The right ventricular size is normal. No increase in right ventricular wall thickness. Right ventricular systolic function is normal. Left Atrium: Left atrial size was normal in size. Right Atrium: Right atrial size was normal in size. Pericardium: There is no evidence of pericardial effusion. Mitral Valve: The mitral valve is normal in structure. Trivial mitral valve regurgitation. No evidence of mitral valve stenosis. Tricuspid Valve: The tricuspid valve is normal in structure. Tricuspid valve regurgitation is not demonstrated. No evidence of tricuspid stenosis. Aortic Valve: The aortic valve is tricuspid. Aortic valve regurgitation is not visualized. No aortic stenosis is present. Pulmonic Valve: The pulmonic valve was normal in structure. Pulmonic valve regurgitation is not visualized. No evidence of pulmonic stenosis. Aorta: The aortic root is normal in size and structure. Venous: The inferior vena cava is normal in size with greater than 50% respiratory variability, suggesting right atrial pressure of 3 mmHg. IAS/Shunts: No atrial level shunt detected by color flow Doppler.  LEFT VENTRICLE PLAX 2D LVIDd:         3.75 cm     Diastology LVIDs:         2.65 cm     LV e' medial:    7.40 cm/s LV PW:         1.00 cm     LV E/e' medial:  10.3 LV IVS:        0.95 cm     LV e' lateral:   13.10 cm/s LVOT diam:     2.10 cm     LV E/e' lateral: 5.8 LV SV:         78 LV SV Index:   45 LVOT Area:     3.46 cm  LV Volumes (MOD) LV vol d, MOD A2C: 66.0 ml LV vol d, MOD A4C: 57.1 ml LV vol s, MOD A2C: 20.0 ml LV vol s, MOD A4C: 15.6 ml LV SV MOD A2C:     46.0 ml LV SV MOD A4C:     57.1 ml LV SV MOD BP:      44.3 ml RIGHT VENTRICLE             IVC RV S prime:     14.10 cm/s  IVC diam: 2.00 cm TAPSE (M-mode): 2.0 cm LEFT ATRIUM             Index        RIGHT ATRIUM           Index LA diam:        3.40 cm 1.95 cm/m   RA Area:     11.30 cm LA Vol (A2C):   33.9 ml 19.45 ml/m  RA Volume:    24.40 ml  14.00 ml/m LA Vol (A4C):   14.1 ml 8.09 ml/m LA Biplane Vol: 23.2 ml 13.31 ml/m  AORTIC VALVE LVOT Vmax:   106.00 cm/s LVOT Vmean:  78.600 cm/s LVOT VTI:    0.226 m  AORTA Ao Root diam: 3.10 cm Ao Asc diam:  3.00 cm MITRAL VALVE MV Area (PHT): 5.32 cm     SHUNTS MV Decel Time: 143 msec     Systemic VTI:  0.23 m MV E velocity: 76.15 cm/s   Systemic Diam: 2.10 cm MV A velocity: 112.00 cm/s MV E/A ratio:  0.68 Kardie Tobb DO Electronically signed by Thomasene Ripple DO Signature Date/Time: 10/06/2022/1:11:02 PM    Final    MR BRAIN W WO CONTRAST  Result Date: 10/06/2022 CLINICAL DATA:  Mental status change, persistent or worsening recurrent syncope EXAM: MRI HEAD WITHOUT AND WITH CONTRAST TECHNIQUE: Multiplanar, multiecho pulse sequences of the brain and surrounding structures were obtained without and with intravenous contrast. CONTRAST:  7mL GADAVIST GADOBUTROL 1 MMOL/ML IV SOLN COMPARISON:  CT Head 10/05/22 FINDINGS: Brain: Negative for an acute infarct. No hemorrhage. No extra-axial fluid collection. Minimal chronic microvascular ischemic change. No contrast-enhancing lesion is visualized. Vascular: Normal flow voids. Skull and upper cervical spine: Normal marrow signal. Sinuses/Orbits: Middle ear or mastoid effusion. Mucosal thickening bilateral ethmoid sinuses. Orbits are unremarkable. Other: None. IMPRESSION: No acute intracranial process. Electronically Signed   By: Lorenza Cambridge M.D.   On: 10/06/2022 10:31   EEG adult  Result Date: 10/06/2022 Charlsie Quest, MD     10/06/2022  8:55 AM Patient Name: Becky Townsend MRN: [id number] Epilepsy Attending: Charlsie Quest Referring Physician/Provider: John Giovanni, MD Date: 10/05/2022 Duration: 27.40 mins Patient history: 52yo F with syncope. EEG to evaluate for seizure Level of alertness: Awake AEDs during EEG study: None Technical aspects: This EEG study was done with scalp electrodes positioned according to the 10-20 International system of  electrode placement. Electrical activity was reviewed with band pass filter of 1-70Hz , sensitivity of 7 uV/mm, display speed of 57mm/sec with a 60Hz  notched filter applied as appropriate. EEG data were recorded continuously and digitally stored.  Video monitoring was available and reviewed as appropriate. Description: The posterior dominant rhythm consists of 9-10 Hz activity of moderate voltage (25-35 uV) seen predominantly in posterior head regions, symmetric and reactive to eye opening and eye closing. Physiologic photic driving was seen during photic stimulation.  Hyperventilation was not performed.   IMPRESSION: This study is within normal limits. No seizures or epileptiform discharges were seen throughout the recording. A normal interictal EEG does not exclude the diagnosis of epilepsy. Priyanka Annabelle Harman   CT HEAD WO CONTRAST ( )  Result Date: 10/05/2022 CLINICAL DATA:  Syncope. EXAM: CT HEAD WITHOUT CONTRAST TECHNIQUE: Contiguous axial images were obtained from the base of the skull through the vertex without intravenous contrast. RADIATION DOSE REDUCTION: This exam was performed according to the departmental dose-optimization program which includes automated exposure control, adjustment of the mA and/or kV according to patient size and/or use of iterative reconstruction technique. COMPARISON:  September 11, 2022 FINDINGS: Brain: No evidence of acute infarction, hemorrhage, hydrocephalus, extra-axial collection or mass lesion/mass effect. Vascular: No hyperdense vessel or unexpected calcification. Skull: Normal. Negative for fracture or focal lesion. Sinuses/Orbits: No acute finding. Other: None. IMPRESSION: No acute intracranial pathology. Electronically Signed   By: Aram Candela M.D.   On: 10/05/2022 23:00   DG Chest 2 View  Result Date: 10/05/2022 CLINICAL DATA:  Syncope. EXAM: CHEST - 2 VIEW COMPARISON:  None Available. FINDINGS: Clear lungs. Normal heart size and mediastinal contours. No  pleural effusion or pneumothorax. Prior left humeral shaft ORIF. Visualized upper abdomen is unremarkable. IMPRESSION: No evidence of acute cardiopulmonary disease. Electronically Signed   By: Orvan Falconer M.D.   On: 10/05/2022 19:35   CT Head Wo Contrast  Result Date: 09/11/2022 CLINICAL DATA:  Syncope.  Possible seizure. EXAM: CT HEAD WITHOUT CONTRAST TECHNIQUE: Contiguous axial images were obtained from the base of the skull through the vertex without intravenous contrast. RADIATION DOSE REDUCTION: This exam was performed  according to the departmental dose-optimization program which includes automated exposure control, adjustment of the mA and/or kV according to patient size and/or use of iterative reconstruction technique. COMPARISON:  None Available. FINDINGS: Brain: There is no mass, hemorrhage or extra-axial collection. The size and configuration of the ventricles and extra-axial CSF spaces are normal. The brain parenchyma is normal, without acute or chronic infarction. Vascular: No abnormal hyperdensity of the major intracranial arteries or dural venous sinuses. No intracranial atherosclerosis. Skull: The visualized skull base, calvarium and extracranial soft tissues are normal. Sinuses/Orbits: No fluid levels or advanced mucosal thickening of the visualized paranasal sinuses. No mastoid or middle ear effusion. The orbits are normal. IMPRESSION: Normal head CT. Electronically Signed   By: Deatra Robinson M.D.   On: 09/11/2022 02:46     Time coordinating discharge: Over 30 minutes    Lewie Chamber, MD  [facility] 10/06/2022, 5:04 PM

## 2022-10-06 NOTE — Procedures (Signed)
Patient Name: Becky Townsend  MRN: [id number]  Epilepsy Attending: Charlsie Quest  Referring Physician/Provider: John Giovanni, MD  Date: 10/05/2022 Duration: 27.40 mins  Patient history: 52yo F with syncope. EEG to evaluate for seizure  Level of alertness: Awake  AEDs during EEG study: None  Technical aspects: This EEG study was done with scalp electrodes positioned according to the 10-20 International system of electrode placement. Electrical activity was reviewed with band pass filter of 1-70Hz , sensitivity of 7 uV/mm, display speed of 1mm/sec with a 60Hz  notched filter applied as appropriate. EEG data were recorded continuously and digitally stored.  Video monitoring was available and reviewed as appropriate.  Description: The posterior dominant rhythm consists of 9-10 Hz activity of moderate voltage (25-35 uV) seen predominantly in posterior head regions, symmetric and reactive to eye opening and eye closing. Physiologic photic driving was seen during photic stimulation.  Hyperventilation was not performed.     IMPRESSION: This study is within normal limits. No seizures or epileptiform discharges were seen throughout the recording.  A normal interictal EEG does not exclude the diagnosis of epilepsy.  Momen Ham Annabelle Harman

## 2022-10-06 NOTE — Care Management (Signed)
  Transition of Care [facility]) Screening Note   Patient Details  Name: SABINA PRIDEMORE Date of Birth: January 10, 1971   Transition of Care [facility]) CM/SW Contact:    Ronny Bacon, RN Phone Number: 10/06/2022, 2:42 PM    Transition of Care Department [facility]) has reviewed patient. Patient presented for syncopal episodes. Discussed patient in progression rounds, case manager asked nurse for PT/OT consult for recommendations. We will continue to monitor patient advancement through interdisciplinary progression rounds. If new patient transition needs arise, please place a TOC consult.

## 2022-10-06 NOTE — Progress Notes (Signed)
  Echocardiogram 2D Echocardiogram has been performed.  Janalyn Harder 10/06/2022, 12:37 PM

## 2022-10-06 NOTE — Progress Notes (Signed)
Ambulated patient to the bathroom with steady gait.

## 2022-10-06 NOTE — Plan of Care (Signed)
  Problem: Education: Goal: Ability to describe self-care measures that may prevent or decrease complications (Diabetes Survival Skills Education) will improve Outcome: Adequate for Discharge Goal: Individualized Educational Video(s) Outcome: Adequate for Discharge   Problem: Coping: Goal: Ability to adjust to condition or change in health will improve Outcome: Adequate for Discharge   Problem: Fluid Volume: Goal: Ability to maintain a balanced intake and output will improve Outcome: Adequate for Discharge   Problem: Health Behavior/Discharge Planning: Goal: Ability to identify and utilize available resources and services will improve Outcome: Adequate for Discharge Goal: Ability to manage health-related needs will improve Outcome: Adequate for Discharge   Problem: Metabolic: Goal: Ability to maintain appropriate glucose levels will improve Outcome: Adequate for Discharge   Problem: Nutritional: Goal: Maintenance of adequate nutrition will improve Outcome: Adequate for Discharge Goal: Progress toward achieving an optimal weight will improve Outcome: Adequate for Discharge   Problem: Skin Integrity: Goal: Risk for impaired skin integrity will decrease Outcome: Adequate for Discharge   Problem: Tissue Perfusion: Goal: Adequacy of tissue perfusion will improve Outcome: Adequate for Discharge   Problem: Education: Goal: Knowledge of condition and prescribed therapy will improve Outcome: Adequate for Discharge   Problem: Cardiac: Goal: Will achieve and/or maintain adequate cardiac output Outcome: Adequate for Discharge   Problem: Physical Regulation: Goal: Complications related to the disease process, condition or treatment will be avoided or minimized Outcome: Adequate for Discharge   Problem: Education: Goal: Knowledge of General Education information will improve Description: Including pain rating scale, medication(s)/side effects and non-pharmacologic comfort  measures Outcome: Adequate for Discharge   Problem: Health Behavior/Discharge Planning: Goal: Ability to manage health-related needs will improve Outcome: Adequate for Discharge   Problem: Clinical Measurements: Goal: Ability to maintain clinical measurements within normal limits will improve Outcome: Adequate for Discharge Goal: Will remain free from infection Outcome: Adequate for Discharge Goal: Diagnostic test results will improve Outcome: Adequate for Discharge Goal: Respiratory complications will improve Outcome: Adequate for Discharge Goal: Cardiovascular complication will be avoided Outcome: Adequate for Discharge   Problem: Activity: Goal: Risk for activity intolerance will decrease Outcome: Adequate for Discharge   Problem: Nutrition: Goal: Adequate nutrition will be maintained Outcome: Adequate for Discharge   Problem: Coping: Goal: Level of anxiety will decrease Outcome: Adequate for Discharge   Problem: Elimination: Goal: Will not experience complications related to bowel motility Outcome: Adequate for Discharge Goal: Will not experience complications related to urinary retention Outcome: Adequate for Discharge   Problem: Pain Managment: Goal: General experience of comfort will improve Outcome: Adequate for Discharge   Problem: Safety: Goal: Ability to remain free from injury will improve Outcome: Adequate for Discharge   Problem: Skin Integrity: Goal: Risk for impaired skin integrity will decrease Outcome: Adequate for Discharge   

## 2022-10-06 NOTE — Progress Notes (Signed)
Discharge instructions (including medications) discussed with and copy provided to patient/caregiver 

## 2022-10-07 ENCOUNTER — Telehealth: Payer: Self-pay

## 2022-10-07 NOTE — Transitions of Care (Post Inpatient/ED Visit) (Signed)
   10/07/2022  Name: KAIA KUNKA MRN: [id number] DOB: 15-Jan-1971  Today's TOC FU Call Status: Today's TOC FU Call Status:: Successful TOC FU Call Competed TOC FU Call Complete Date: 10/07/22  Transition Care Management Follow-up Telephone Call Date of Discharge: 10/06/22 Discharge Facility: [facility]) Type of Discharge: Inpatient Admission Reason for ED Visit: Other: (syncope) How have you been since you were released from the hospital?: Better Any questions or concerns?: No  Items Reviewed: Did you receive and understand the discharge instructions provided?: Yes Medications obtained,verified, and reconciled?: Yes (Medications Reviewed) Any new allergies since your discharge?: No Dietary orders reviewed?: Yes Do you have support at home?: No  Medications Reviewed Today: Medications Reviewed Today     Reviewed by Karena Addison, LPN (Licensed Practical Nurse) on 10/07/22 at 1206  Med List Status: <None>   Medication Order Taking? Sig Documenting Provider Last Dose Status Informant  blood glucose meter kit and supplies [id number] Yes Dispense based on patient and insurance preference. Use up to four times daily as directed. (FOR ICD-10 E10.9, E11.9). [name], FNP Taking Active Self  blood glucose meter kit and supplies [id number] Yes E11 whatever insurance will pay for [name], FNP Taking Active Self  Blood Glucose Monitoring Suppl (ONE TOUCH ULTRA 2) w/Device KIT [id number] Yes Use up to four times daily Dx E11.69 [name], FNP Taking Active Self  glucose blood [facility] ULTRA) test strip [id number] Yes Test BS daily Dx E11.69 [name], FNP Taking Active Self  Lancets [facility] ULTRASOFT) lancets [id number] Yes Test BS daily Dx E11.69 [name], FNP Taking Active Self  rosuvastatin (CRESTOR) 5 MG tablet [id number] Yes Take 1 tablet (5 mg total) by mouth daily. [name], FNP Taking Active Self  UNABLE TO Derl Barrow [id number] Yes Med  Name: Eylea (aflibercept) eye injection [provider] Taking Active Self            Home Care and Equipment/Supplies: Were Home Health Services Ordered?: NA Any new equipment or medical supplies ordered?: NA  Functional Questionnaire: Do you need assistance with bathing/showering or dressing?: No Do you need assistance with meal preparation?: No Do you need assistance with eating?: No Do you have difficulty maintaining continence: No Do you need assistance with getting out of bed/getting out of a chair/moving?: No Do you have difficulty managing or taking your medications?: No  Follow up appointments reviewed: PCP Follow-up appointment confirmed?: No (no avail appt times, sent message to staff to schedule) Specialist Hospital Follow-up appointment confirmed?: NA Do you need transportation to your follow-up appointment?: No Do you understand care options if your condition(s) worsen?: Yes-patient verbalized understanding    SIGNATURE Karena Addison, LPN [facility] Nurse Health Advisor Direct Dial [phone]

## 2022-10-14 ENCOUNTER — Other Ambulatory Visit (INDEPENDENT_AMBULATORY_CARE_PROVIDER_SITE_OTHER): Payer: 59

## 2022-10-14 ENCOUNTER — Encounter: Payer: Self-pay | Admitting: Family Medicine

## 2022-10-14 ENCOUNTER — Ambulatory Visit (INDEPENDENT_AMBULATORY_CARE_PROVIDER_SITE_OTHER): Payer: 59 | Admitting: Family Medicine

## 2022-10-14 VITALS — BP 129/80 | HR 94 | Temp 98.2°F | Ht 61.0 in | Wt 161.0 lb

## 2022-10-14 DIAGNOSIS — I951 Orthostatic hypotension: Secondary | ICD-10-CM

## 2022-10-14 DIAGNOSIS — R55 Syncope and collapse: Secondary | ICD-10-CM

## 2022-10-14 LAB — ANEMIA PROFILE B
Basos: 1 %
Platelets: 207 10*3/uL (ref 150–450)

## 2022-10-14 LAB — CMP14+EGFR

## 2022-10-14 LAB — VITAMIN D 25 HYDROXY (VIT D DEFICIENCY, FRACTURES)

## 2022-10-14 NOTE — Progress Notes (Signed)
Follow up ED visit   Subjective   Patient ID: Becky Townsend, female    DOB: [birth year]  Age: 52 y.o. MRN: [id number]  CC:  Chief Complaint  Patient presents with   Follow-up   HPI Becky Townsend presents to follow up from ED visits  Reports that first syncopal episode was one month. She was standing in the kitchen talking to her daughter and looking down at her phone when she loss consciousness and fell. Reports that since Mother's Day she has had 7 syncopal episode (all of which were seated). States that typically she will be up walking around or doing something, go to sit down, pick up her phone and while looking at it, she will experience LOC event. States that she has an aura of a feeling that moves from her jawline to the front of her face, describes it as tingling then her vision will go black before she LOCs. LOC episodes last 45 seconds to a minute or so per husband. Reports that you have to "wake her up" when it is over. Reports that during one event she started snoring. Presented to ED on 5/13 and was admitted for workup. Completed CT of head, MRI of brain, EKG, EEG, and some laboratory studies that have all been inconclusive. Reports that she did not have a syncopal episode while in the hospital. She has a history of T2DM and utilizes a Libre 2 that she checks during events. BG averages 100-120 during episodes. Has started monitoring HR during episodes and reports that it is normal. Also endorses ringing in her ears that changes as she has syncopal episodes.   Outpatient Encounter Medications as of 10/14/2022  Medication Sig   blood glucose meter kit and supplies Dispense based on patient and insurance preference. Use up to four times daily as directed. (FOR ICD-10 E10.9, E11.9).   blood glucose meter kit and supplies E11 whatever insurance will pay for   Blood Glucose Monitoring Suppl (ONE TOUCH ULTRA 2) w/Device KIT Use up to four times daily Dx E11.69   glucose blood (ONETOUCH  ULTRA) test strip Test BS daily Dx E11.69   Lancets (ONETOUCH ULTRASOFT) lancets Test BS daily Dx E11.69   lisinopril (ZESTRIL) 5 MG tablet Take 5 mg by mouth daily.   rosuvastatin (CRESTOR) 5 MG tablet Take 1 tablet (5 mg total) by mouth daily.   UNABLE TO FIND Med Name: Eylea (aflibercept) eye injection   No facility-administered encounter medications on file as of 10/14/2022.    Past Medical History:  Diagnosis Date   Anxiety    History of kidney stones    Medical history non-contributory    PONV (postoperative nausea and vomiting)    even though no surgeries is very prone to nausea and vomiting with many pain medications in the past           Past Surgical History:  Procedure Laterality Date   NO PAST SURGERIES     ORIF HUMERUS FRACTURE Left 06/28/2018   Procedure: OPEN REDUCTION INTERNAL FIXATION (ORIF) HUMERUS FRACTURE;  Surgeon: Yolonda Kida, MD;  Location: [facility] OR;  Service: Orthopedics;  Laterality: Left;  120 mins    Family History  Problem Relation Age of Onset   Hypertension Father    Breast cancer Neg Hx     Social History   Socioeconomic History   Marital status: Married    Spouse name: Not on file   Number of children: Not on file   Years of  education: Not on file   Highest education level: Not on file  Occupational History   Not on file  Tobacco Use   Smoking status: Never   Smokeless tobacco: Never  Vaping Use   Vaping Use: Never used  Substance and Sexual Activity   Alcohol use: Yes    Comment: social only not often at all   Drug use: Never   Sexual activity: Yes    Birth control/protection: None  Other Topics Concern   Not on file  Social History Narrative   Not on file   Social Determinants of Health   Financial Resource Strain: Not on file  Food Insecurity: No Food Insecurity (10/05/2022)   Hunger Vital Sign    Worried About Running Out of Food in the Last Year: Never true    Ran Out of Food in the Last Year: Never true   Transportation Needs: No Transportation Needs (10/05/2022)   PRAPARE - Administrator, Civil Service (Medical): No    Lack of Transportation (Non-Medical): No  Physical Activity: Not on file  Stress: Not on file  Social Connections: Not on file  Intimate Partner Violence: Not At Risk (10/05/2022)   Humiliation, Afraid, Rape, and Kick questionnaire    Fear of Current or Ex-Partner: No    Emotionally Abused: No    Physically Abused: No    Sexually Abused: No    ROS As per hpi  Objective   BP 129/80   Pulse 94   Temp 98.2 F (36.8 C)   Ht 5\' 1"  (1.549 m)   Wt 161 lb (73 kg)   SpO2 95%   BMI 30.42 kg/m   Physical Exam Constitutional:      General: She is awake. She is not in acute distress.    Appearance: Normal appearance. She is well-developed, well-groomed and overweight. She is not ill-appearing, toxic-appearing or diaphoretic.  HENT:     Right Ear: A middle ear effusion is present. There is no impacted cerumen. No foreign body. No mastoid tenderness. No PE tube. No hemotympanum. Tympanic membrane is not injected, scarred, perforated, erythematous, retracted or bulging.     Left Ear: A middle ear effusion is present. There is impacted cerumen. No foreign body. No mastoid tenderness. No PE tube. No hemotympanum. Tympanic membrane is not injected, scarred, perforated, erythematous, retracted or bulging.  Eyes:     Conjunctiva/sclera:     Left eye: Left conjunctiva is injected. Hemorrhage present.  Neck:     Thyroid: No thyroid mass or thyromegaly.  Cardiovascular:     Rate and Rhythm: Normal rate and regular rhythm.     Pulses:          Radial pulses are 2+ on the right side and 2+ on the left side.       Posterior tibial pulses are 2+ on the right side and 2+ on the left side.     Heart sounds: Normal heart sounds.  Pulmonary:     Effort: Pulmonary effort is normal.     Breath sounds: Normal breath sounds. No stridor, decreased air movement or transmitted  upper airway sounds. No decreased breath sounds, wheezing, rhonchi or rales.  Musculoskeletal:     Cervical back: Full passive range of motion without pain and neck supple.     Right lower leg: No edema.     Left lower leg: No edema.  Feet:     Comments: Brace on right foot  Neurological:     Mental  Status: She is alert, oriented to person, place, and time and easily aroused. Mental status is at baseline.     GCS: GCS eye subscore is 4. GCS verbal subscore is 5. GCS motor subscore is 6.     Cranial Nerves: Cranial nerves 2-12 are intact.     Motor: Motor function is intact.     Coordination: Romberg sign positive. Finger-Nose-Finger Test and Heel to [facility] Test normal. Rapid alternating movements normal.     Gait: Gait normal.  Psychiatric:        Attention and Perception: Attention and perception normal.        Mood and Affect: Mood and affect normal.        Speech: Speech normal.        Behavior: Behavior normal. Behavior is cooperative.        Thought Content: Thought content normal.        Cognition and Memory: Cognition and memory normal.        Judgment: Judgment normal.     Assessment & Plan:  1. Syncope, unspecified syncope type Labs as below. Will communicate results to patient once available.  Zio applied in office today.  Stat referral to neurology and Cardiology for additional evaluation. EEG during admission did not capture event, neither did telemetry. Will assess for B12 and Vitamin D deficiency as below. Instructed patient on safety precautions such as driving, aspiration, and trauma with falls. Echo results as below.  - Anemia Profile B - VITAMIN D 25 Hydroxy (Vit-D Deficiency, Fractures) - CMP14+EGFR - LONG TERM MONITOR (3-14 DAYS); Future - Ambulatory referral to Neurology - Ambulatory referral to Cardiology  2. Orthostatic hypotension Positive orthostatic hypotension in office today. However, given nature of syncope/LOC (events occur after patient moves and  changes position to sitting rather than standing) would like to assess and evaluate for other cause of syncopal episodes. Provided written and verbal instruction of orthostatic hypotension.  Continue to hold lisinopril at this time.   Imaging and Exams from ED visit to [facility] 5/13-5/14/24  ECHO 10/06/2022  1. Left ventricular ejection fraction, by estimation, is 60 to 65%. The  left ventricle has normal function. The left ventricle has no regional  wall motion abnormalities. Left ventricular diastolic parameters are  indeterminate.   2. Right ventricular systolic function is normal. The right ventricular  size is normal.   3. The mitral valve is normal in structure. Trivial mitral valve  regurgitation. No evidence of mitral stenosis.   4. The aortic valve is tricuspid. Aortic valve regurgitation is not  visualized. No aortic stenosis is present.   5. The inferior vena cava is normal in size with greater than 50%  respiratory variability, suggesting right atrial pressure of 3 mmHg.   MR Brain W/WO Contrast 10/06/22 IMPRESSION: No acute intracranial process.  CT Head WO Contrast 10/05/22 IMPRESSION: No acute intracranial pathology.  EEG 10/06/22  IMPRESSION: This study is within normal limits. No seizures or epileptiform discharges were seen throughout the recording.  The above assessment and management plan was discussed with the patient. The patient verbalized understanding of and has agreed to the management plan using shared-decision making. Patient is aware to call the clinic if they develop any new symptoms or if symptoms fail to improve or worsen. Patient is aware when to return to the clinic for a follow-up visit. Patient educated on when it is appropriate to go to the emergency department.   Return if symptoms worsen or fail to improve.  Neale Burly, DNP-FNP [facility] [address] [place], [place] [address] [phone]

## 2022-10-15 LAB — CMP14+EGFR
ALT: 36 IU/L — ABNORMAL HIGH (ref 0–32)
AST: 21 IU/L (ref 0–40)
Albumin/Globulin Ratio: 2 (ref 1.2–2.2)
Albumin: 4.5 g/dL (ref 3.8–4.9)
BUN/Creatinine Ratio: 38 — ABNORMAL HIGH (ref 9–23)
Bilirubin Total: 0.5 mg/dL (ref 0.0–1.2)
CO2: 23 mmol/L (ref 20–29)
Chloride: 104 mmol/L (ref 96–106)
Creatinine, Ser: 0.9 mg/dL (ref 0.57–1.00)
Globulin, Total: 2.2 g/dL (ref 1.5–4.5)
Glucose: 111 mg/dL — ABNORMAL HIGH (ref 70–99)
Potassium: 4.6 mmol/L (ref 3.5–5.2)
Total Protein: 6.7 g/dL (ref 6.0–8.5)
eGFR: 76 mL/min/{1.73_m2} (ref >59)

## 2022-10-15 LAB — ANEMIA PROFILE B
Basophils Absolute: 0.1 10*3/uL (ref 0.0–0.2)
EOS (ABSOLUTE): 0.2 10*3/uL (ref 0.0–0.4)
Eos: 3 %
Ferritin: 159 ng/mL — ABNORMAL HIGH (ref 15–150)
Folate: 6.6 ng/mL (ref >3.0)
Hematocrit: 40.8 % (ref 34.0–46.6)
Hemoglobin: 13.2 g/dL (ref 11.1–15.9)
Immature Grans (Abs): 0 10*3/uL (ref 0.0–0.1)
Immature Granulocytes: 0 %
Iron Saturation: 22 % (ref 15–55)
Lymphocytes Absolute: 1.9 10*3/uL (ref 0.7–3.1)
Lymphs: 25 %
MCH: 29.1 pg (ref 26.6–33.0)
MCHC: 32.4 g/dL (ref 31.5–35.7)
MCV: 90 fL (ref 79–97)
Monocytes Absolute: 0.4 10*3/uL (ref 0.1–0.9)
Monocytes: 6 %
Neutrophils Absolute: 4.9 10*3/uL (ref 1.4–7.0)
Neutrophils: 65 %
RBC: 4.53 x10E6/uL (ref 3.77–5.28)
RDW: 14.1 % (ref 11.7–15.4)
Retic Ct Pct: 1.1 % (ref 0.6–2.6)
Total Iron Binding Capacity: 300 ug/dL (ref 250–450)
Vitamin B-12: 681 pg/mL (ref 232–1245)
WBC: 7.4 10*3/uL (ref 3.4–10.8)

## 2022-10-15 MED ORDER — VITAMIN D (ERGOCALCIFEROL) 1.25 MG (50000 UNIT) PO CAPS: 50000.0000 [IU] | ORAL_CAPSULE | ORAL | 0 refills | Status: DC

## 2022-10-15 NOTE — Addendum Note (Signed)
Addended by: Neale Burly on: 10/15/2022 12:45 PM   Modules accepted: Orders

## 2022-10-21 ENCOUNTER — Encounter: Payer: Self-pay | Admitting: Neurology

## 2022-10-21 ENCOUNTER — Ambulatory Visit (INDEPENDENT_AMBULATORY_CARE_PROVIDER_SITE_OTHER): Payer: 59 | Admitting: Neurology

## 2022-10-21 VITALS — BP 100/80 | HR 93 | Ht 61.0 in | Wt 160.0 lb

## 2022-10-21 DIAGNOSIS — R55 Syncope and collapse: Secondary | ICD-10-CM

## 2022-10-21 DIAGNOSIS — I951 Orthostatic hypotension: Secondary | ICD-10-CM

## 2022-10-21 NOTE — Progress Notes (Signed)
[facility]  PATIENT: Becky Townsend DOB: Apr 23, 1971  REQUESTING CLINICIAN: Arrie Senate* HISTORY FROM: Patient and Husband  REASON FOR VISIT: Recurrent syncope    HISTORICAL  CHIEF COMPLAINT:  Chief Complaint  Patient presents with   New Patient (Initial Visit)    Rm 12, with husband [name], NP, syncope episodes, happens when sitting back down from walking, or just randomly faints    HISTORY OF PRESENT ILLNESS:  This is a 52 year old woman past medical history of including history of diabetes, Charcot foot, hypertension, hyperlipidemia who is presenting with complaint of syncope.  Patient reported in the last 6 to 7-week she had a total of 7 to 8 syncopal episodes.  Most of these episodes happened when she is standing or sitting down.  She reports prior to the episodes, she will have a feeling of head rush followed by tunnel vision and she is out for 5 seconds sometimes 15 seconds. When she wakes her up, she denies any confusion, she recognizes her husband recognize her surroundings.  She said most of the time before passing out she will attempt to call her husband by his name.  Denies any generalized convulsion with these episodes, no tongue biting or urinary incontinence.  Patient was recently admitted to the hospital, had full workup including MRI, EEG which were negative and she is currently wearing a cardiac monitor.  In the hospital she was found to have orthostatic hypotension, her lisinopril was discontinued.  After discontinued the lisinopril she did follow-up with her PCP again found to be to have orthostatic hypotension and today also in the office shows positive for orthostatic vitals.   OTHER MEDICAL CONDITIONS: History of diabetes, Charcot foot, Hypertension, Hyperlipidemia,    REVIEW OF SYSTEMS: Full 14 system review of systems performed and negative with exception of: As noted in the HPI  ALLERGIES: Allergies  Allergen Reactions    Percocet [Oxycodone-Acetaminophen] Nausea And Vomiting   Sudafed [Pseudoephedrine]     Hives    HOME MEDICATIONS: Outpatient Medications Prior to Visit  Medication Sig Dispense Refill   blood glucose meter kit and supplies Dispense based on patient and insurance preference. Use up to four times daily as directed. (FOR ICD-10 E10.9, E11.9). 1 each 0   blood glucose meter kit and supplies E11 whatever insurance will pay for 1 each 0   Blood Glucose Monitoring Suppl (ONE TOUCH ULTRA 2) w/Device KIT Use up to four times daily Dx E11.69 1 kit 0   glucose blood (ONETOUCH ULTRA) test strip Test BS daily Dx E11.69 100 each 3   Lancets (ONETOUCH ULTRASOFT) lancets Test BS daily Dx E11.69 100 each 3   loratadine (CLARITIN) 10 MG tablet Take 10 mg by mouth daily.     rosuvastatin (CRESTOR) 5 MG tablet Take 1 tablet (5 mg total) by mouth daily. 90 tablet 3   UNABLE TO FIND Med Name: Eylea (aflibercept) eye injection     Vitamin D, Ergocalciferol, (DRISDOL) 1.25 MG (50000 UNIT) CAPS capsule Take 1 capsule (50,000 Units total) by mouth every 7 (seven) days for 12 doses. 12 capsule 0   lisinopril (ZESTRIL) 5 MG tablet Take 5 mg by mouth daily. (Patient not taking: Reported on 10/21/2022)     No facility-administered medications prior to visit.    PAST MEDICAL HISTORY: Past Medical History:  Diagnosis Date   Anxiety    Charcot foot due to diabetes mellitus (HCC)    Diabetes (HCC)    History of kidney stones  Medical history non-contributory    PONV (postoperative nausea and vomiting)    even though no surgeries is very prone to nausea and vomiting with many pain medications in the past            PAST SURGICAL HISTORY: Past Surgical History:  Procedure Laterality Date   NO PAST SURGERIES     ORIF HUMERUS FRACTURE Left 06/28/2018   Procedure: OPEN REDUCTION INTERNAL FIXATION (ORIF) HUMERUS FRACTURE;  Surgeon: Yolonda Kida, MD;  Location: [facility] OR;  Service: Orthopedics;  Laterality: Left;   120 mins    FAMILY HISTORY: Family History  Problem Relation Age of Onset   Hypertension Father    Breast cancer Neg Hx     SOCIAL HISTORY: Social History   Socioeconomic History   Marital status: Married    Spouse name: Not on file   Number of children: Not on file   Years of education: Not on file   Highest education level: Not on file  Occupational History   Not on file  Tobacco Use   Smoking status: Never   Smokeless tobacco: Never  Vaping Use   Vaping Use: Never used  Substance and Sexual Activity   Alcohol use: Not Currently    Comment: social only not often at all   Drug use: Never   Sexual activity: Yes    Birth control/protection: None  Other Topics Concern   Not on file  Social History Narrative   Right handed   Lives at home with husband    Caffeine- none   Social Determinants of Health   Financial Resource Strain: Not on file  Food Insecurity: No Food Insecurity (10/05/2022)   Hunger Vital Sign    Worried About Running Out of Food in the Last Year: Never true    Ran Out of Food in the Last Year: Never true  Transportation Needs: No Transportation Needs (10/05/2022)   PRAPARE - Administrator, Civil Service (Medical): No    Lack of Transportation (Non-Medical): No  Physical Activity: Not on file  Stress: Not on file  Social Connections: Not on file  Intimate Partner Violence: Not At Risk (10/05/2022)   Humiliation, Afraid, Rape, and Kick questionnaire    Fear of Current or Ex-Partner: No    Emotionally Abused: No    Physically Abused: No    Sexually Abused: No    PHYSICAL EXAM  GENERAL EXAM/CONSTITUTIONAL: Vitals:  Vitals:   10/21/22 1131 10/21/22 1138  BP: 120/78 100/80  Pulse:  93  SpO2:  97%  Weight: 160 lb (72.6 kg)   Height: 5\' 1"  (1.549 m)    Body mass index is 30.23 kg/m. Wt Readings from Last 3 Encounters:  10/21/22 160 lb (72.6 kg)  10/14/22 161 lb (73 kg)  10/06/22 165 lb 9.1 oz (75.1 kg)   Patient is in no  distress; well developed, nourished and groomed; neck is supple  MUSCULOSKELETAL: Gait, strength, tone, movements noted in Neurologic exam below  NEUROLOGIC: MENTAL STATUS:      No data to display         awake, alert, oriented to person, place and time recent and remote memory intact normal attention and concentration language fluent, comprehension intact, naming intact fund of knowledge appropriate  CRANIAL NERVE:  2nd, 3rd, 4th, 6th - Visual fields full to confrontation, extraocular muscles intact, no nystagmus 5th - facial sensation symmetric 7th - facial strength symmetric 8th - hearing intact 9th - palate elevates symmetrically, uvula midline  11th - shoulder shrug symmetric 12th - tongue protrusion midline  MOTOR:  normal bulk and tone, full strength in the BUE, BLE  SENSORY:  normal and symmetric to light touch  COORDINATION:  finger-nose-finger, fine finger movements normal  REFLEXES:  deep tendon reflexes present and symmetric  GAIT/STATION:  normal    DIAGNOSTIC DATA (LABS, IMAGING, TESTING) - I reviewed patient records, labs, notes, testing and imaging myself where available.  Lab Results  Component Value Date   WBC 7.4 10/14/2022   HGB 13.2 10/14/2022   HCT 40.8 10/14/2022   MCV 90 10/14/2022   PLT 207 10/14/2022      Component Value Date/Time   NA 141 10/14/2022 1620   K 4.6 10/14/2022 1620   CL 104 10/14/2022 1620   CO2 23 10/14/2022 1620   GLUCOSE 111 (H) 10/14/2022 1620   GLUCOSE 133 (H) 10/05/2022 1746   BUN 34 (H) 10/14/2022 1620   CREATININE 0.90 10/14/2022 1620   CALCIUM 10.0 10/14/2022 1620   PROT 6.7 10/14/2022 1620   ALBUMIN 4.5 10/14/2022 1620   AST 21 10/14/2022 1620   ALT 36 (H) 10/14/2022 1620   ALKPHOS 79 10/14/2022 1620   BILITOT 0.5 10/14/2022 1620   GFRNONAA >60 10/05/2022 1746   Lab Results  Component Value Date   CHOL 227 (H) 04/06/2022   HDL 45 04/06/2022   LDLCALC 162 (H) 04/06/2022   TRIG 112  04/06/2022   CHOLHDL 5.0 (H) 04/06/2022   Lab Results  Component Value Date   HGBA1C 5.5 10/05/2022   Lab Results  Component Value Date   VITAMINB12 681 10/14/2022   Lab Results  Component Value Date   TSH 1.962 10/05/2022    MRI Brain with and without contrast 10/06/2022 No acute intracranial process   Routine EEG 10/06/2022: Normal   ASSESSMENT AND PLAN  52 y.o. year old female with history of diabetes, Charcot foot, hypertension, hyperlipidemia who is presenting with complaint of multiple syncope.  She reports prodrome of tingling sensation on top of the head, head rush followed by tunnel vision.  She reports calling her husband prior to passing out.  There were no tongue biting no urinary incontinence and no postictal confusion.  In the hospital her MRI brain was negative and her EEG did not show any epileptiform discharge.  Again today on exam she was found to have positive orthostatic vitals.  I have explained to the patient that based on history and prodrome, she is having vasovagal syncope likely due to orthostatic hypotension.  For now I have advised her to increase her fluid intake, she can add Pedialyte, Gatorade, electrolyte packets into her water bottle.  I have also advised her to start using compression socks up to her thighs.  She can also consider abdominal binder.  When she has tried all of these and her symptoms are not resolved to please contact us.  I also advised her to follow-up with the cardiologist regarding her Zio patch.  Continue to follow with PCP return as needed.   1. Orthostatic hypotension   2. Vasovagal syncope      Patient Instructions  Continue current medications  Increase water intake, including electrolytes, Gatorade, Pedialyte's  Start using compression socks up to thigh  Consider abdominal binders Continue to follow up with PCP  Return as needed   No orders of the defined types were placed in this encounter.   No orders of the defined  types were placed in this encounter.   Return if  symptoms worsen or fail to improve.    Windell Norfolk, MD 10/21/2022, 10:52 PM  [facility] [address], [address] [place], [place] [address] [phone]

## 2022-10-21 NOTE — Patient Instructions (Signed)
Continue current medications  Increase water intake, including electrolytes, Gatorade, Pedialyte's  Start using compression socks up to thigh  Consider abdominal binders Continue to follow up with PCP  Return as needed

## 2022-10-23 ENCOUNTER — Other Ambulatory Visit: Payer: Self-pay | Admitting: Family Medicine

## 2022-10-27 MED ORDER — VITAMIN D (ERGOCALCIFEROL) 1.25 MG (50000 UNIT) PO CAPS: 50000.0000 [IU] | ORAL_CAPSULE | ORAL | 0 refills | Status: DC

## 2022-11-10 ENCOUNTER — Encounter (HOSPITAL_COMMUNITY)
Admission: EM | Disposition: A | Discharge status: Home / Self Care | Payer: Self-pay | Source: Home / Self Care | Attending: Cardiology

## 2022-11-10 ENCOUNTER — Other Ambulatory Visit: Payer: Self-pay

## 2022-11-10 ENCOUNTER — Other Ambulatory Visit: Payer: Self-pay | Admitting: Family Medicine

## 2022-11-10 ENCOUNTER — Encounter (HOSPITAL_COMMUNITY): Payer: Self-pay

## 2022-11-10 ENCOUNTER — Inpatient Hospital Stay (HOSPITAL_COMMUNITY)
Admission: EM | Admit: 2022-11-10 | Discharge: 2022-11-12 | DRG: 243 | Disposition: A | Discharge status: Home / Self Care | Payer: 59 | Attending: Cardiology | Admitting: Cardiology

## 2022-11-10 DIAGNOSIS — I459 Conduction disorder, unspecified: Secondary | ICD-10-CM | POA: Diagnosis present

## 2022-11-10 DIAGNOSIS — E11610 Type 2 diabetes mellitus with diabetic neuropathic arthropathy: Secondary | ICD-10-CM | POA: Diagnosis present

## 2022-11-10 DIAGNOSIS — I10 Essential (primary) hypertension: Secondary | ICD-10-CM | POA: Diagnosis present

## 2022-11-10 DIAGNOSIS — R55 Syncope and collapse: Principal | ICD-10-CM

## 2022-11-10 DIAGNOSIS — I442 Atrioventricular block, complete: Secondary | ICD-10-CM | POA: Diagnosis not present

## 2022-11-10 DIAGNOSIS — Z885 Allergy status to narcotic agent status: Secondary | ICD-10-CM

## 2022-11-10 DIAGNOSIS — Z8249 Family history of ischemic heart disease and other diseases of the circulatory system: Secondary | ICD-10-CM

## 2022-11-10 DIAGNOSIS — Z79899 Other long term (current) drug therapy: Secondary | ICD-10-CM

## 2022-11-10 DIAGNOSIS — T82110A Breakdown (mechanical) of cardiac electrode, initial encounter: Secondary | ICD-10-CM | POA: Diagnosis present

## 2022-11-10 DIAGNOSIS — E785 Hyperlipidemia, unspecified: Secondary | ICD-10-CM | POA: Diagnosis present

## 2022-11-10 DIAGNOSIS — Y710 Diagnostic and monitoring cardiovascular devices associated with adverse incidents: Secondary | ICD-10-CM | POA: Diagnosis present

## 2022-11-10 HISTORY — PX: PACEMAKER IMPLANT: EP1218

## 2022-11-10 LAB — CBC WITH DIFFERENTIAL/PLATELET
Abs Immature Granulocytes: 0.01 10*3/uL (ref 0.00–0.07)
Basophils Absolute: 0.1 10*3/uL (ref 0.0–0.1)
Basophils Relative: 1 %
Eosinophils Absolute: 0.3 10*3/uL (ref 0.0–0.5)
Eosinophils Relative: 5 %
HCT: 37.5 % (ref 36.0–46.0)
Hemoglobin: 12.6 g/dL (ref 12.0–15.0)
Immature Granulocytes: 0 %
Lymphocytes Relative: 29 %
Lymphs Abs: 1.9 10*3/uL (ref 0.7–4.0)
MCH: 30.5 pg (ref 26.0–34.0)
MCHC: 33.6 g/dL (ref 30.0–36.0)
MCV: 90.8 fL (ref 80.0–100.0)
Monocytes Absolute: 0.4 10*3/uL (ref 0.1–1.0)
Monocytes Relative: 6 %
Neutro Abs: 3.8 10*3/uL (ref 1.7–7.7)
Neutrophils Relative %: 59 %
Platelets: 187 10*3/uL (ref 150–400)
RBC: 4.13 MIL/uL (ref 3.87–5.11)
RDW: 13.3 % (ref 11.5–15.5)
WBC: 6.4 10*3/uL (ref 4.0–10.5)
nRBC: 0 % (ref 0.0–0.2)

## 2022-11-10 LAB — BASIC METABOLIC PANEL
Anion gap: 9 (ref 5–15)
Anion gap: 8 (ref 5–15)
BUN: 43 mg/dL — ABNORMAL HIGH (ref 6–20)
BUN: 35 mg/dL — ABNORMAL HIGH (ref 6–20)
CO2: 26 mmol/L (ref 22–32)
CO2: 25 mmol/L (ref 22–32)
Calcium: 9.2 mg/dL (ref 8.9–10.3)
Calcium: 9.3 mg/dL (ref 8.9–10.3)
Chloride: 103 mmol/L (ref 98–111)
Chloride: 106 mmol/L (ref 98–111)
Creatinine, Ser: 0.79 mg/dL (ref 0.44–1.00)
Creatinine, Ser: 0.7 mg/dL (ref 0.44–1.00)
GFR, Estimated: >60 mL/min (ref >60)
GFR, Estimated: >60 mL/min (ref >60)
Glucose, Bld: 117 mg/dL — ABNORMAL HIGH (ref 70–99)
Glucose, Bld: 98 mg/dL (ref 70–99)
Potassium: 4.5 mmol/L (ref 3.5–5.1)
Potassium: 4 mmol/L (ref 3.5–5.1)
Sodium: 138 mmol/L (ref 135–145)
Sodium: 139 mmol/L (ref 135–145)

## 2022-11-10 LAB — CBC
HCT: 38.7 % (ref 36.0–46.0)
Hemoglobin: 12.3 g/dL (ref 12.0–15.0)
MCH: 28.9 pg (ref 26.0–34.0)
MCHC: 31.8 g/dL (ref 30.0–36.0)
MCV: 91.1 fL (ref 80.0–100.0)
Platelets: 200 10*3/uL (ref 150–400)
RBC: 4.25 MIL/uL (ref 3.87–5.11)
RDW: 13.6 % (ref 11.5–15.5)
WBC: 7 10*3/uL (ref 4.0–10.5)
nRBC: 0 % (ref 0.0–0.2)

## 2022-11-10 LAB — URINALYSIS, ROUTINE W REFLEX MICROSCOPIC
Bilirubin Urine: NEGATIVE
Glucose, UA: NEGATIVE mg/dL
Hgb urine dipstick: NEGATIVE
Ketones, ur: NEGATIVE mg/dL
Leukocytes,Ua: NEGATIVE
Nitrite: NEGATIVE
Protein, ur: NEGATIVE mg/dL
Specific Gravity, Urine: 1.006 (ref 1.005–1.030)
pH: 6 (ref 5.0–8.0)

## 2022-11-10 LAB — GLUCOSE, CAPILLARY
Glucose-Capillary: 100 mg/dL — ABNORMAL HIGH (ref 70–99)
Glucose-Capillary: 108 mg/dL — ABNORMAL HIGH (ref 70–99)

## 2022-11-10 LAB — I-STAT BETA HCG BLOOD, ED (MC, WL, AP ONLY): I-stat hCG, quantitative: <5 m[IU]/mL (ref <5)

## 2022-11-10 LAB — CBG MONITORING, ED: Glucose-Capillary: 103 mg/dL — ABNORMAL HIGH (ref 70–99)

## 2022-11-10 LAB — MAGNESIUM: Magnesium: 2 mg/dL (ref 1.7–2.4)

## 2022-11-10 LAB — TSH: TSH: 1.56 u[IU]/mL (ref 0.350–4.500)

## 2022-11-10 SURGERY — PACEMAKER IMPLANT

## 2022-11-10 MED ORDER — LORATADINE 10 MG PO TABS
10.0000 mg | ORAL_TABLET | Freq: Every day | ORAL | Status: DC
  Administered 2022-11-10 – 2022-11-12 (×3): 10 mg via ORAL
  Filled 2022-11-10 (×3): qty 1

## 2022-11-10 MED ORDER — ROSUVASTATIN CALCIUM 5 MG PO TABS
5.0000 mg | ORAL_TABLET | Freq: Every day | ORAL | Status: DC
  Administered 2022-11-10 – 2022-11-12 (×3): 5 mg via ORAL
  Filled 2022-11-10 (×3): qty 1

## 2022-11-10 MED ORDER — LIDOCAINE HCL (PF) 1 % IJ SOLN
INTRAMUSCULAR | Status: DC | PRN
  Administered 2022-11-10: 20 mL
  Administered 2022-11-10: 60 mL

## 2022-11-10 MED ORDER — SODIUM CHLORIDE 0.9 % IV SOLN: INTRAVENOUS | Status: DC

## 2022-11-10 MED ORDER — INSULIN ASPART 100 UNIT/ML IJ SOLN: 0.0000 [IU] | Freq: Three times a day (TID) | INTRAMUSCULAR | Status: DC

## 2022-11-10 MED ORDER — CEFAZOLIN SODIUM-DEXTROSE 1-4 GM/50ML-% IV SOLN
1.0000 g | Freq: Four times a day (QID) | INTRAVENOUS | Status: AC
  Administered 2022-11-10 – 2022-11-11 (×3): 1 g via INTRAVENOUS
  Filled 2022-11-10 (×3): qty 50

## 2022-11-10 MED ORDER — LIDOCAINE HCL 1 % IJ SOLN
INTRAMUSCULAR | Status: AC
  Filled 2022-11-10: qty 80

## 2022-11-10 MED ORDER — ONDANSETRON HCL 4 MG/2ML IJ SOLN: 4.0000 mg | Freq: Four times a day (QID) | INTRAMUSCULAR | Status: DC | PRN

## 2022-11-10 MED ORDER — FENTANYL CITRATE (PF) 100 MCG/2ML IJ SOLN
INTRAMUSCULAR | Status: AC
  Filled 2022-11-10: qty 2

## 2022-11-10 MED ORDER — HEPARIN (PORCINE) IN NACL 1000-0.9 UT/500ML-% IV SOLN
INTRAVENOUS | Status: DC | PRN
  Administered 2022-11-10: 500 mL

## 2022-11-10 MED ORDER — MIDAZOLAM HCL 5 MG/5ML IJ SOLN
INTRAMUSCULAR | Status: DC | PRN
  Administered 2022-11-10 (×2): 1 mg via INTRAVENOUS

## 2022-11-10 MED ORDER — FENTANYL CITRATE (PF) 100 MCG/2ML IJ SOLN
INTRAMUSCULAR | Status: DC | PRN
  Administered 2022-11-10 (×2): 25 ug via INTRAVENOUS

## 2022-11-10 MED ORDER — CEFAZOLIN SODIUM-DEXTROSE 2-4 GM/100ML-% IV SOLN
INTRAVENOUS | Status: AC
  Filled 2022-11-10: qty 100

## 2022-11-10 MED ORDER — IBUPROFEN 200 MG PO TABS
400.0000 mg | ORAL_TABLET | Freq: Four times a day (QID) | ORAL | Status: DC | PRN
  Administered 2022-11-10 – 2022-11-11 (×4): 400 mg via ORAL
  Filled 2022-11-10: qty 1
  Filled 2022-11-10 (×2): qty 2
  Filled 2022-11-10: qty 1
  Filled 2022-11-10 (×2): qty 2
  Filled 2022-11-10: qty 1

## 2022-11-10 MED ORDER — CHLORHEXIDINE GLUCONATE 4 % EX SOLN: 60.0000 mL | Freq: Once | CUTANEOUS | Status: DC

## 2022-11-10 MED ORDER — VITAMIN D (ERGOCALCIFEROL) 1.25 MG (50000 UNIT) PO CAPS: 50000.0000 [IU] | ORAL_CAPSULE | ORAL | Status: DC

## 2022-11-10 MED ORDER — VITAMIN D (ERGOCALCIFEROL) 1.25 MG (50000 UNIT) PO CAPS
50000.0000 [IU] | ORAL_CAPSULE | ORAL | Status: DC
  Filled 2022-11-10: qty 1

## 2022-11-10 MED ORDER — SODIUM CHLORIDE 0.9 % IV SOLN
80.0000 mg | INTRAVENOUS | Status: AC
  Administered 2022-11-10: 80 mg

## 2022-11-10 MED ORDER — SODIUM CHLORIDE 0.9 % IV SOLN
INTRAVENOUS | Status: AC
  Filled 2022-11-10: qty 2

## 2022-11-10 MED ORDER — FLUTICASONE PROPIONATE 50 MCG/ACT NA SUSP
1.0000 | Freq: Every day | NASAL | Status: DC
  Administered 2022-11-11 – 2022-11-12 (×2): 1 via NASAL
  Filled 2022-11-10: qty 16

## 2022-11-10 MED ORDER — MIDAZOLAM HCL 5 MG/5ML IJ SOLN
INTRAMUSCULAR | Status: AC
  Filled 2022-11-10: qty 5

## 2022-11-10 MED ORDER — CEFAZOLIN SODIUM-DEXTROSE 2-4 GM/100ML-% IV SOLN
2.0000 g | INTRAVENOUS | Status: AC
  Administered 2022-11-10: 2 g via INTRAVENOUS

## 2022-11-10 MED ORDER — ACETAMINOPHEN 325 MG PO TABS: 650.0000 mg | ORAL_TABLET | Freq: Four times a day (QID) | ORAL | Status: DC | PRN

## 2022-11-10 SURGICAL SUPPLY — 13 items
CABLE SURGICAL S-101-97-12 (CABLE) ×1 IMPLANT
CATH RIGHTSITE C315HIS02 (CATHETERS) IMPLANT
IPG PACE AZUR XT DR MRI W1DR01 (Pacemaker) IMPLANT
LEAD CAPSURE NOVUS 5076-52CM (Lead) IMPLANT
LEAD SELECT SECURE 3830 383069 (Lead) IMPLANT
PACE AZURE XT DR MRI W1DR01 (Pacemaker) ×1 IMPLANT
PAD DEFIB RADIO PHYSIO CONN (PAD) ×1 IMPLANT
SELECT SECURE 3830 383069 (Lead) ×1 IMPLANT
SHEATH 7FR PRELUDE SNAP 13 (SHEATH) IMPLANT
SHEATH PROBE COVER 6X72 (BAG) IMPLANT
SLITTER 6232ADJ (MISCELLANEOUS) IMPLANT
TRAY PACEMAKER INSERTION (PACKS) ×1 IMPLANT
WIRE HI TORQ VERSACORE-J 145CM (WIRE) IMPLANT

## 2022-11-10 NOTE — H&P (Addendum)
ELECTROPHYSIOLOGY CONSULT NOTE    Patient ID: Becky Townsend MRN: [id number], DOB/AGE: 12/10/1970 52 y.o.  Admit date: 11/10/2022 Date of Consult: 11/10/2022  Primary Physician: [name], FNP Primary Cardiologist: None  Electrophysiologist: New   Referring Provider: Dr. [name]  Patient Profile: Becky Townsend is a 52 y.o. female with a history of syncope, DM2, HTN, HLD, and anxiety who is being seen today for the evaluation of syncope and intermittent CHB at the request of Dr. [name].  HPI:  Becky Townsend is a 52 y.o. female with medical history as above.   Pt seen in ER for syncope 09/11/2022 and work up [employer].   Admitted 5/13 - 10/06/22 with recurrent syncope. Orthostatics were positive, but otherwise work up unremarkable with normal Echo and brain MRI.   Monitor placed and worn from 5/22 - 10/28/2022. Resulted today nd showed 5 pauses, and least 2 associated with syncope, and lasting up to 16.5s at a time. When called pt had continued to have syncopal episodes, up to several a day for the past several days. Husband thinks they are lasting longer and that she is becoming more difficult to rouse. Pt instructed to present to ED for evaluation and PPM consideration.   Pt is currently asymptomatic at rest. Reports history as above.  Denies recent illness in the past several months. No ticks have been removed. She denies family history of SCD, drowning, or crib deaths. She has not been told that she has had issues with her HR before. During an episode, she feels flushing and pressure that starts in her neck and rises up through her head until she ultimately passes out after getting tunnel vision. The very first episode occurred during standing; but have since almost entirely occurred while sitting.   Labs Potassium4.5 (06/18 1153) Magnesium  2.0 (06/18 1153) Creatinine, ser  0.79 (06/18 1153) PLT  200 (06/18 1153) HGB  12.3 (06/18 1153) WBC 7.0 (06/18 1153)  .     Past Medical History:  Diagnosis Date   Anxiety    Charcot foot due to diabetes mellitus (HCC)    Diabetes (HCC)    History of kidney stones    Medical history non-contributory    PONV (postoperative nausea and vomiting)    even though no surgeries is very prone to nausea and vomiting with many pain medications in the past             Surgical History:  Past Surgical History:  Procedure Laterality Date   NO PAST SURGERIES     ORIF HUMERUS FRACTURE Left 06/28/2018   Procedure: OPEN REDUCTION INTERNAL FIXATION (ORIF) HUMERUS FRACTURE;  Surgeon: Yolonda Kida, MD;  Location: [facility] OR;  Service: Orthopedics;  Laterality: Left;  120 mins     (Not in a hospital admission)   Inpatient Medications:   Allergies:  Allergies  Allergen Reactions   Percocet [Oxycodone-Acetaminophen] Nausea And Vomiting   Sudafed [Pseudoephedrine]     Hives    Family History  Problem Relation Age of Onset   Hypertension Father    Breast cancer Neg Hx      Physical Exam: Vitals:   11/10/22 1137 11/10/22 1150  BP: (!) 161/95   Pulse: 90   Resp: 17   Temp: 98 F (36.7 C)   TempSrc: Oral   SpO2: 100%   Weight:  72.6 kg  Height:  5\' 1"  (1.549 m)    GEN- NAD, A&O x 3, normal affect HEENT: Normocephalic, atraumatic Lungs-  CTAB, Normal effort.  Heart- Regular rate and rhythm, No M/G/R.  GI- Soft, NT, ND.  Extremities- No clubbing, cyanosis, or edema   Radiology/Studies: LONG TERM MONITOR (3-14 DAYS)  Result Date: 11/10/2022 Pt called and still remains symptomatic with multiple syncopal episodes over the last several says. Have place STAT referrals to EP and cardiology but due to continued symptoms, pt sent to ED. Patch Wear Time:  13 days and 23 hours (2024-05-22T16:12:49-0400 to 2024-06-05T16:12:41-0400) Patient had a min HR of 24 bpm, max HR of 118 bpm, and avg HR of 89 bpm. Predominant underlying rhythm was Sinus Rhythm. 5 Pauses occurred, the longest lasting 16.5 secs (4 bpm). True  duration of Pause(s) difficult to ascertain due to the presence of artifact. Pause(s) occurred due to Complete Heart Block and Possible High Grade AV Block. 3 episode(s) of AV Block (High Grade and 3rd) occurred, lasting a total of 15 secs. Second Degree AV Block-Mobitz I (Wenckebach) was present. Pause, AV Block (3rd) and Wenckebach were detected within +/- 45 seconds of symptomatic patient event(s). Isolated SVEs were rare (<1.0%), and no SVE Couplets or SVE Triplets were present. Isolated VEs were rare (<1.0%, 8), VE Triplets were rare (<1.0%, 1), and no VE Couplets were present. Difficulty discerning atrial activity making definitive diagnosis difficult to ascertain. MD notification criteria for Symptomatic High Grade AV Block (13.9-16.5 seconds of Ventricular Asystole) and Complete Heart Block (6.7-10.1 Seconds of Ventricular Asystole) met - report posted prior to notification per account request (FR).       EKG: on arrival shows NSR with HRs in 90s without conduction disease (personally reviewed)  TELEMETRY: NSR 80-90s (personally reviewed)  Assessment/Plan:  Recurrent syncope Intermittent CHB Without conduction disease at baseline Not on AV nodal agents No reversible cause identified Echo 10/06/2022 LVEF 60-65%, no WMA It is unlikely that a cMRI would change therapy Explained risks, benefits, and alternatives to PPM implantation, including but not limited to bleeding, infection, pneumothorax, pericardial effusion, lead dislodgement, heart attack, stroke, or death.  Pt verbalized understanding and agrees to proceed.  [name] Stable.  While some case reports have associated increased cardiac risk from charcot disease (including conduction abnormalities), especially in the setting of neuropathic complications from DM, no clear correlation.    Plan for PPM this afternoon most likely.   For questions or updates, please contact [facility] Please consult [email or web address] for  contact info under Cardiology/STEMI.  Signed, [name], PA-C  11/10/2022 2:33 PM   I have seen and examined this patient with [name].  Agree with above, note added to reflect my findings.  She has a past medical history as above.  Briefly, she presents to the hospital after multiple episodes of syncope.  She was wearing a cardiac monitor which showed 5 pauses, 2 of which were associated with syncope.  During 1 episode, she had a 16-second pause.  Her husband feels like these episodes are getting longer and she has become more difficult to arouse.  She is comfortable at rest in bed.  GEN: Well nourished, well developed, in no acute distress  HEENT: normal  Neck: no JVD, carotid bruits, or masses Cardiac: RRR; no murmurs, rubs, or gallops,no edema  Respiratory:  clear to auscultation bilaterally, normal work of breathing GI: soft, nontender, nondistended, + BS MS: no deformity or atrophy  Skin: warm and dry Neuro:  Strength and sensation are intact Psych: euthymic mood, full affect   Recurrent syncope with intermittent complete heart block: Has pauses of  up to 16 seconds.  There are no reversible causes.  Due to that, we Parul Porcelli plan for pacemaker implant.  Risk and benefits have been discussed as above.  She understands these risks and is agreed to the procedure.  Kamren Heskett M. Charnell Peplinski MD 11/10/2022 3:36 PM

## 2022-11-10 NOTE — ED Triage Notes (Signed)
Pt directed to ED by PCP.  Sts "they got the results of my heart monitor and said I have a significant blockage."  Pt denies current complaints.  Pt was admitted to hospital for numerous syncopal episodes in May.  Pt has continued to have syncopal episodes.  Pt reports "feeling a lump" in her neck prior to these episodes.

## 2022-11-10 NOTE — ED Provider Notes (Signed)
[place] EMERGENCY DEPARTMENT AT [facility] Provider Note   CSN: [id number] Arrival date & time: 11/10/22  1132     History  Chief Complaint  Patient presents with   Possible Heart Blockage   Loss of Consciousness    Becky Townsend is a 52 y.o. female with T2DM, HLD, HTN, orthostatic hypotension who presents with LOC.  Patient presents with her husband who provides additional history.  Patient states that over the last 3 months she has had increasingly frequent episodes of syncope at home.  She has had an admission and workup including MRI, echocardiogram that were unrevealing.  She will follow-up with neurology which was okay and had yet to follow-up with cardiology.  With her PCP she did a heart monitor and got the results today.  PCP told her that she had intermittent episodes of complete heart block and was directed directly to the emergency department.  Patient is continuing to have syncopal episodes, had 4 on Friday, 3 on Saturday, for example.  They typically happen when she is seated, not always when she is standing from sitting or in an orthostatic manner.  She does not always feel lightheaded prior to the episodes.  Does not have any chest pain.  Loss of Consciousness      Home Medications Prior to Admission medications   Medication Sig Start Date End Date Taking? Authorizing Provider  fluticasone [facility] ALLERGY RELIEF) 50 MCG/ACT nasal spray Place 1 spray into both nostrils daily.   Yes [provider]  ibuprofen (ADVIL) 200 MG tablet Take 400 mg by mouth every 6 (six) hours as needed for moderate pain.   Yes [provider]  loratadine (CLARITIN) 10 MG tablet Take 10 mg by mouth daily.   Yes [provider]  rosuvastatin (CRESTOR) 5 MG tablet Take 1 tablet (5 mg total) by mouth daily. 04/07/22  Yes Hawks, Christy A, FNP  UNABLE TO FIND Inject 1 Dose into the eye See admin instructions. Med Name: Eylea (aflibercept) eye injection  every 6 weeks.   Yes [provider]  Vitamin D, Ergocalciferol, (DRISDOL) 1.25 MG (50000 UNIT) CAPS capsule Take 1 capsule (50,000 Units total) by mouth every 7 (seven) days for 12 doses. 10/27/22 01/13/23 Yes Hawks, Edilia Bo, FNP  blood glucose meter kit and supplies Dispense based on patient and insurance preference. Use up to four times daily as directed. (FOR ICD-10 E10.9, E11.9). 04/06/22   [name], FNP  blood glucose meter kit and supplies E11 whatever insurance will pay for 04/07/22   Jannifer Rodney A, FNP  Blood Glucose Monitoring Suppl (ONE TOUCH ULTRA 2) w/Device KIT Use up to four times daily Dx E11.69 04/06/22   [name], FNP  glucose blood (ONETOUCH ULTRA) test strip Test BS daily Dx E11.69 04/09/22   [name], FNP  Lancets [facility] ULTRASOFT) lancets Test BS daily Dx E11.69 04/09/22   Jannifer Rodney A, FNP  lisinopril (ZESTRIL) 5 MG tablet Take 5 mg by mouth daily. Patient not taking: Reported on 10/21/2022 10/10/22   [provider]      Allergies    Percocet [oxycodone-acetaminophen] and Sudafed [pseudoephedrine]    Review of Systems   Review of Systems  Cardiovascular:  Positive for syncope.   A 10 point review of systems was performed and is negative unless otherwise reported in HPI.  Physical Exam Updated Vital Signs BP (!) 158/98 (BP Location: Right Arm)   Pulse 93   Temp 98.1 F (  36.7 C) (Oral)   Resp 18   Ht 5\' 1"  (1.549 m)   Wt 72.6 kg   SpO2 100%   BMI 30.23 kg/m  Physical Exam General: Normal appearing female, lying in bed.  HEENT: PERRLA, Sclera anicteric, MMM, trachea midline.  Cardiology: RRR, no murmurs/rubs/gallops. BL radial and DP pulses equal bilaterally.  Resp: Normal respiratory rate and effort. CTAB, no wheezes, rhonchi, crackles.  Abd: Soft, non-tender, non-distended. No rebound tenderness or guarding.  GU: Deferred. MSK: No peripheral edema or signs of trauma. Extremities without deformity or TTP. No  cyanosis or clubbing. Skin: warm, dry. Neuro: A&Ox4, CNs II-XII grossly intact. MAEs. Sensation grossly intact.  Psych: Normal mood and affect.   ED Results / Procedures / Treatments   Labs (all labs ordered are listed, but only abnormal results are displayed) Labs Reviewed  BASIC METABOLIC PANEL - Abnormal; Notable for the following components:      Result Value   Glucose, Bld 117 (*)    BUN 43 (*)    All other components within normal limits  URINALYSIS, ROUTINE W REFLEX MICROSCOPIC - Abnormal; Notable for the following components:   Color, Urine STRAW (*)    Bacteria, UA RARE (*)    All other components within normal limits  CBG MONITORING, ED - Abnormal; Notable for the following components:   Glucose-Capillary 103 (*)    All other components within normal limits  SURGICAL PCR SCREEN  CBC  MAGNESIUM  LYME DISEASE SEROLOGY W/REFLEX  BASIC METABOLIC PANEL  CBC WITH DIFFERENTIAL/PLATELET  TSH  I-STAT BETA HCG BLOOD, ED ([facility], [facility], [facility] ONLY)    EKG EKG Interpretation  Date/Time:  Tuesday November 10 2022 12:13:34 EDT Ventricular Rate:  92 PR Interval:  177 QRS Duration: 95 QT Interval:  362 QTC Calculation: 448 R Axis:   77 Text Interpretation: Sinus rhythm Confirmed by Vivi Barrack [phone]) on 11/10/2022 12:16:10 PM  Radiology LONG TERM MONITOR (3-14 DAYS)  Result Date: 11/10/2022 Pt called and still remains symptomatic with multiple syncopal episodes over the last several says. Have place STAT referrals to EP and cardiology but due to continued symptoms, pt sent to ED. Patch Wear Time:  13 days and 23 hours (2024-05-22T16:12:49-0400 to 2024-06-05T16:12:41-0400) Patient had a min HR of 24 bpm, max HR of 118 bpm, and avg HR of 89 bpm. Predominant underlying rhythm was Sinus Rhythm. 5 Pauses occurred, the longest lasting 16.5 secs (4 bpm). True duration of Pause(s) difficult to ascertain due to the presence of artifact. Pause(s) occurred due to Complete Heart Block and Possible  High Grade AV Block. 3 episode(s) of AV Block (High Grade and 3rd) occurred, lasting a total of 15 secs. Second Degree AV Block-Mobitz I (Wenckebach) was present. Pause, AV Block (3rd) and Wenckebach were detected within +/- 45 seconds of symptomatic patient event(s). Isolated SVEs were rare (<1.0%), and no SVE Couplets or SVE Triplets were present. Isolated VEs were rare (<1.0%, 8), VE Triplets were rare (<1.0%, 1), and no VE Couplets were present. Difficulty discerning atrial activity making definitive diagnosis difficult to ascertain. MD notification criteria for Symptomatic High Grade AV Block (13.9-16.5 seconds of Ventricular Asystole) and Complete Heart Block (6.7-10.1 Seconds of Ventricular Asystole) met - report posted prior to notification per account request (FR).    Procedures Procedures    Medications Ordered in ED Medications  chlorhexidine (HIBICLENS) 4 % liquid 4 Application (has no administration in time range)  chlorhexidine (HIBICLENS) 4 % liquid 4 Application (has no administration in time  range)  0.9 %  sodium chloride infusion (has no administration in time range)  0.9 %  sodium chloride infusion (has no administration in time range)  gentamicin (GARAMYCIN) 80 mg in sodium chloride 0.9 % 500 mL irrigation (has no administration in time range)  ceFAZolin (ANCEF) IVPB 2g/100 mL premix (has no administration in time range)  ceFAZolin (ANCEF) 2-4 GM/100ML-% IVPB (has no administration in time range)  sodium chloride 0.9 % with gentamicin (GARAMYCIN) ADS Med (has no administration in time range)    ED Course/ Medical Decision Making/ A&P                          Medical Decision Making Amount and/or Complexity of Data Reviewed Labs: ordered. Decision-making details documented in ED Course.  Risk Decision regarding hospitalization.    This patient presents to the ED for concern of recurrent syncope, monitor showing intermittent CHB, this involves an extensive number  of treatment options, and is a complaint that carries with it a high risk of complications and morbidity.  I considered the following differential and admission for this acute, potentially life threatening condition.   MDM:    Per chart review media tab, can see heart monitor which does demonstrate episodes of complete heart block as well as Herbie Saxon type block and sinus pauses..  Patient is abducted telemetry here and has been normal sinus rhythm without any episodes of lightheadedness, syncope or complete heart block noted here so far.  She has no active chest pain or shortness of breath indicate ACS or PE.  No seizure-like activity or focal neurodeficits to indicate stroke.  Her EKG does not demonstrate any prolonged QT.  She has no electrolyte derangements and is not pregnant.  Patient is consulted to cardiology for admission.    Clinical Course as of 11/10/22 1531  Tue Nov 10, 2022  1218 CBC wnl [HN]  1218 I-stat hCG, quantitative: <5.0 neg [HN]  1309 Magnesium: 2.0 wnl [HN]  1310 Basic metabolic panel(!) Mildly elevated BUN otherwise unremarkable. [HN]  1311 Consulted to cardiology.  [HN]  1322 Dr. Anne Fu will come to see patient [HN]    Clinical Course User Index [HN] Loetta Rough, MD    Labs: I Ordered, and personally interpreted labs.  The pertinent results include:  those listed above  Additional history obtained from chart review.  External records from outside source obtained and reviewed including [facility] monitor report  Cardiac Monitoring: The patient was maintained on a cardiac monitor.  I personally viewed and interpreted the cardiac monitored which showed an underlying rhythm of: MSR  Reevaluation: After the interventions noted above, I reevaluated the patient and found that they have :stayed the same  Social Determinants of Health: Patient lives independently   Disposition:  Admit for intermittent CHB and pacemaker placement.  Co morbidities that  complicate the patient evaluation  Past Medical History:  Diagnosis Date   Anxiety    Charcot foot due to diabetes mellitus (HCC)    Diabetes (HCC)    History of kidney stones    Medical history non-contributory    PONV (postoperative nausea and vomiting)    even though no surgeries is very prone to nausea and vomiting with many pain medications in the past             Medicines Meds ordered this encounter  Medications   chlorhexidine (HIBICLENS) 4 % liquid 4 Application   chlorhexidine (HIBICLENS) 4 % liquid 4  Application   0.9 %  sodium chloride infusion   0.9 %  sodium chloride infusion   gentamicin (GARAMYCIN) 80 mg in sodium chloride 0.9 % 500 mL irrigation   ceFAZolin (ANCEF) IVPB 2g/100 mL premix    Order Specific Question:   Indication:    Answer:   Surgical Prophylaxis   ceFAZolin (ANCEF) 2-4 GM/100ML-% IVPB    Kalman Jewels L: cabinet override   sodium chloride 0.9 % with gentamicin (GARAMYCIN) ADS Med    [employer] Jewels L: cabinet override    I have reviewed the patients home medicines and have made adjustments as needed  Problem List / ED Course: Problem List Items Addressed This Visit   None Visit Diagnoses     Syncope and collapse    -  Primary   Intermittent complete heart block (HCC)                       This note was created using dictation software, which may contain spelling or grammatical errors.    Loetta Rough, MD 11/10/22 [phone]

## 2022-11-11 ENCOUNTER — Ambulatory Visit (HOSPITAL_COMMUNITY): Payer: 59

## 2022-11-11 ENCOUNTER — Encounter (HOSPITAL_COMMUNITY): Payer: Self-pay | Admitting: Cardiology

## 2022-11-11 ENCOUNTER — Other Ambulatory Visit: Payer: Self-pay

## 2022-11-11 ENCOUNTER — Encounter (HOSPITAL_COMMUNITY)
Admission: EM | Disposition: A | Discharge status: Home / Self Care | Payer: Self-pay | Source: Home / Self Care | Attending: Cardiology

## 2022-11-11 DIAGNOSIS — I459 Conduction disorder, unspecified: Secondary | ICD-10-CM

## 2022-11-11 DIAGNOSIS — T82120A Displacement of cardiac electrode, initial encounter: Secondary | ICD-10-CM

## 2022-11-11 DIAGNOSIS — Z8249 Family history of ischemic heart disease and other diseases of the circulatory system: Secondary | ICD-10-CM | POA: Diagnosis not present

## 2022-11-11 DIAGNOSIS — I442 Atrioventricular block, complete: Secondary | ICD-10-CM | POA: Diagnosis present

## 2022-11-11 DIAGNOSIS — I10 Essential (primary) hypertension: Secondary | ICD-10-CM | POA: Diagnosis present

## 2022-11-11 DIAGNOSIS — R55 Syncope and collapse: Secondary | ICD-10-CM | POA: Diagnosis present

## 2022-11-11 DIAGNOSIS — Z885 Allergy status to narcotic agent status: Secondary | ICD-10-CM | POA: Diagnosis not present

## 2022-11-11 DIAGNOSIS — E11610 Type 2 diabetes mellitus with diabetic neuropathic arthropathy: Secondary | ICD-10-CM | POA: Diagnosis present

## 2022-11-11 DIAGNOSIS — Y710 Diagnostic and monitoring cardiovascular devices associated with adverse incidents: Secondary | ICD-10-CM | POA: Diagnosis present

## 2022-11-11 DIAGNOSIS — Z79899 Other long term (current) drug therapy: Secondary | ICD-10-CM | POA: Diagnosis not present

## 2022-11-11 DIAGNOSIS — T82110A Breakdown (mechanical) of cardiac electrode, initial encounter: Secondary | ICD-10-CM | POA: Diagnosis present

## 2022-11-11 DIAGNOSIS — E785 Hyperlipidemia, unspecified: Secondary | ICD-10-CM | POA: Diagnosis present

## 2022-11-11 HISTORY — PX: LEAD REVISION/REPAIR: EP1213

## 2022-11-11 LAB — GLUCOSE, CAPILLARY
Glucose-Capillary: 98 mg/dL (ref 70–99)
Glucose-Capillary: 91 mg/dL (ref 70–99)
Glucose-Capillary: 84 mg/dL (ref 70–99)
Glucose-Capillary: 133 mg/dL — ABNORMAL HIGH (ref 70–99)

## 2022-11-11 SURGERY — LEAD REVISION/REPAIR

## 2022-11-11 MED ORDER — MIDAZOLAM HCL 5 MG/5ML IJ SOLN
INTRAMUSCULAR | Status: AC
  Filled 2022-11-11: qty 5

## 2022-11-11 MED ORDER — CHLORHEXIDINE GLUCONATE 4 % EX SOLN
60.0000 mL | Freq: Once | CUTANEOUS | Status: AC
  Administered 2022-11-11: 4 via TOPICAL
  Filled 2022-11-11: qty 15

## 2022-11-11 MED ORDER — CEFAZOLIN SODIUM-DEXTROSE 2-4 GM/100ML-% IV SOLN: 2.0000 g | INTRAVENOUS | Status: AC

## 2022-11-11 MED ORDER — ONDANSETRON HCL 4 MG/2ML IJ SOLN: 4.0000 mg | Freq: Four times a day (QID) | INTRAMUSCULAR | Status: DC | PRN

## 2022-11-11 MED ORDER — MIDAZOLAM HCL 5 MG/5ML IJ SOLN
INTRAMUSCULAR | Status: DC | PRN
  Administered 2022-11-11 (×2): 1 mg via INTRAVENOUS

## 2022-11-11 MED ORDER — CEFAZOLIN SODIUM-DEXTROSE 2-4 GM/100ML-% IV SOLN
INTRAVENOUS | Status: AC
  Administered 2022-11-11: 2 g via INTRAVENOUS
  Filled 2022-11-11: qty 100

## 2022-11-11 MED ORDER — CHLORHEXIDINE GLUCONATE 4 % EX SOLN: 60.0000 mL | Freq: Once | CUTANEOUS | Status: AC

## 2022-11-11 MED ORDER — FENTANYL CITRATE (PF) 100 MCG/2ML IJ SOLN
INTRAMUSCULAR | Status: AC
  Filled 2022-11-11: qty 2

## 2022-11-11 MED ORDER — SODIUM CHLORIDE 0.9 % IV SOLN
80.0000 mg | INTRAVENOUS | Status: AC
  Filled 2022-11-11: qty 2

## 2022-11-11 MED ORDER — SODIUM CHLORIDE 0.9 % IV SOLN
INTRAVENOUS | Status: AC
  Administered 2022-11-11: 80 mg
  Filled 2022-11-11: qty 2

## 2022-11-11 MED ORDER — CEFAZOLIN SODIUM-DEXTROSE 1-4 GM/50ML-% IV SOLN
1.0000 g | Freq: Four times a day (QID) | INTRAVENOUS | Status: DC
  Administered 2022-11-11 – 2022-11-12 (×2): 1 g via INTRAVENOUS
  Filled 2022-11-11 (×3): qty 50

## 2022-11-11 MED ORDER — HEPARIN (PORCINE) IN NACL 1000-0.9 UT/500ML-% IV SOLN
INTRAVENOUS | Status: DC | PRN
  Administered 2022-11-11: 500 mL

## 2022-11-11 MED ORDER — COLCHICINE 0.6 MG PO TABS
0.6000 mg | ORAL_TABLET | Freq: Two times a day (BID) | ORAL | Status: DC
  Administered 2022-11-11 – 2022-11-12 (×3): 0.6 mg via ORAL
  Filled 2022-11-11 (×3): qty 1

## 2022-11-11 MED ORDER — SODIUM CHLORIDE 0.9 % IV SOLN: INTRAVENOUS | Status: DC

## 2022-11-11 MED ORDER — ACETAMINOPHEN 325 MG PO TABS: 325.0000 mg | ORAL_TABLET | ORAL | Status: DC | PRN

## 2022-11-11 MED ORDER — LIDOCAINE HCL (PF) 1 % IJ SOLN
INTRAMUSCULAR | Status: DC | PRN
  Administered 2022-11-11: 55 mL

## 2022-11-11 MED ORDER — FENTANYL CITRATE (PF) 100 MCG/2ML IJ SOLN
INTRAMUSCULAR | Status: DC | PRN
  Administered 2022-11-11 (×2): 25 ug via INTRAVENOUS

## 2022-11-11 SURGICAL SUPPLY — 9 items
CABLE SURGICAL S-101-97-12 (CABLE) ×1 IMPLANT
KIT MICROPUNCTURE NIT STIFF (SHEATH) IMPLANT
KIT WRENCH (KITS) IMPLANT
LEAD CAPSURE NOVUS 5076-52CM (Lead) IMPLANT
PAD DEFIB RADIO PHYSIO CONN (PAD) ×1 IMPLANT
POUCH AIGIS-R ANTIBACT PPM (Mesh General) ×1 IMPLANT
POUCH AIGIS-R ANTIBACT PPM MED (Mesh General) IMPLANT
SHEATH 7FR PRELUDE SNAP 13 (SHEATH) IMPLANT
TRAY PACEMAKER INSERTION (PACKS) ×1 IMPLANT

## 2022-11-11 NOTE — TOC Initial Note (Signed)
Transition of Care [facility]) - Initial/Assessment Note    Patient Details  Name: Becky Townsend MRN: [id number] Date of Birth: 12-27-1970  [facility]) CM/SW Contact:    Leone Haven, RN Phone Number: 11/11/2022, 3:19 PM  Clinical Narrative:                 From home with spouse, indep, she has PCP, Jannifer Rodney, has insurance on file.  She has no DME that she is currently using or HH services in place.  Her spouse is her support system, he will transport her home at dc.  She gets her medications from  [facility] on [address].   Expected Discharge Plan: Home/Self Care Barriers to Discharge: Continued Medical Work up   Patient Goals and CMS Choice Patient states their goals for this hospitalization and ongoing recovery are:: return home with spouse   Choice offered to / list presented to : NA      Expected Discharge Plan and Services In-house Referral: NA, [facility] Discharge Planning Services: CM Consult Post Acute Care Choice: NA                   DME Arranged: N/A DME Agency: NA       HH Arranged: NA          Prior Living Arrangements/Services   Lives with:: Spouse Patient language and need for interpreter reviewed:: Yes Do you feel safe going back to the place where you live?: Yes      Need for Family Participation in Patient Care: Yes (Comment) Care giver support system in place?: Yes (comment)   Criminal Activity/Legal Involvement Pertinent to Current Situation/Hospitalization: No - Comment as needed  Activities of Daily Living      Permission Sought/Granted                  Emotional Assessment Appearance:: Appears stated age Attitude/Demeanor/Rapport: Engaged Affect (typically observed): Appropriate Orientation: : Oriented to Self, Oriented to Place, Oriented to  Time, Oriented to Situation Alcohol / Substance Use: Not Applicable Psych Involvement: No (comment)  Admission diagnosis:  Syncope and collapse [R55] Intermittent  complete heart block (HCC) [I44.2] Heart block [I45.9] Patient Active Problem List   Diagnosis Date Noted   Heart block 11/10/2022   Orthostatic hypotension 10/06/2022   HTN (hypertension) 10/05/2022   Constipation 10/05/2022   Charcot arthropathy of midfoot 04/24/2022   Diabetes mellitus (HCC) 04/07/2022   Hyperlipidemia associated with type 2 diabetes mellitus (HCC) 04/07/2022   PCP:  [name], FNP Pharmacy:   [facility] [phone] - [place], [place] - [address] AT [facility] OF [address] & [address] [place] [address] Phone: [phone] Fax: [phone]  [facility] [address] [place] [address] Phone: [phone] Fax: [phone]     Social Determinants of Health (SDOH) Social History: SDOH Screenings   Food Insecurity: No Food Insecurity (10/05/2022)  Housing: Low Risk  (10/05/2022)  Transportation Needs: No Transportation Needs (10/05/2022)  Utilities: Not At Risk (10/05/2022)  Depression (PHQ2-9): Low Risk  (10/14/2022)  Tobacco Use: Low Risk  (11/11/2022)   SDOH Interventions:     Readmission Risk Interventions     No data to display

## 2022-11-11 NOTE — Progress Notes (Signed)
   11/11/22 1752  Vitals  Temp 98.3 F (36.8 C)  Temp Source Oral  BP 129/79  MAP (mmHg) 94  BP Location Right Arm  BP Method Automatic  Patient Position (if appropriate) Lying  Pulse Rate 86  Pulse Rate Source Monitor  ECG Heart Rate 86  Resp 16  Level of Consciousness  Level of Consciousness Alert  MEWS COLOR  MEWS Score Color Green  Oxygen Therapy  SpO2 99 %  O2 Device Room Air  Pain Assessment  Pain Scale 0-10  Pain Score 0  MEWS Score  MEWS Temp 0  MEWS Systolic 0  MEWS Pulse 0  MEWS RR 0  MEWS LOC 0  MEWS Score 0   Patient arrived back from a lead revision from the cath lab. Patient complains of 0/10 pain. Site is clean dry and intact with no signs of hematoma. VS obtained and stable. Transfer orders released and patient is ordering dinner. Family at bedside. Call bell within reach. Bed is lowest position.

## 2022-11-11 NOTE — H&P (View-Only) (Signed)
Patient Name: Becky Townsend Date of Encounter: 11/11/2022  Primary Cardiologist: None Electrophysiologist: None  Interval Summary   Feeling OK this am but having a catch when she takes a deep breath, as well as pain on deep inspiration and lying flat.  Inpatient Medications    Scheduled Meds:  fluticasone  1 spray Each Nare Daily   insulin aspart  0-15 Units Subcutaneous TID WC   loratadine  10 mg Oral Daily   rosuvastatin  5 mg Oral Daily   Vitamin D (Ergocalciferol)  50,000 Units Oral Q Wed   Continuous Infusions:   ceFAZolin (ANCEF) IV 1 g (11/11/22 0204)   PRN Meds: acetaminophen, ibuprofen, ondansetron (ZOFRAN) IV   Vital Signs    Vitals:   11/10/22 1737 11/10/22 1927 11/11/22 0010 11/11/22 0426  BP:  128/77 104/68 94/64  Pulse: 89 88 87 85  Resp:  18 18 18   Temp:  97.6 F (36.4 C) 98 F (36.7 C) 98.2 F (36.8 C)  TempSrc:  Oral Oral Oral  SpO2: 100%  99% 98%  Weight:    73.9 kg  Height:       No intake or output data in the 24 hours ending 11/11/22 0746 Filed Weights   11/10/22 1150 11/11/22 0426  Weight: 72.6 kg 73.9 kg    Physical Exam    GEN- The patient is well appearing, alert and oriented x 3 today.   Lungs- Clear to ausculation bilaterally, normal work of breathing Cardiac- Regular rate and rhythm, no murmurs, rubs or gallops GI- soft, NT, ND, + BS Extremities- no clubbing or cyanosis. No edema  Telemetry    NSR with intermittent V pacing 70-90s (personally reviewed)  Hospital Course    Becky Townsend is a 52 y.o. female with a history of syncope, Townsend, HTN, HLD, and anxiety who is being seen today for the evaluation of syncope and intermittent CHB at the request of Becky Townsend.   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 Elberta Townsend Pleuritic chest pain started over night and  worsened, now with deep breathing and significant with lying flat.   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   Explained risks, benefits, and alternatives to lead revision, including but not limited to bleeding, infection, pneumothorax, pericardial effusion, lead dislodgement, heart attack, stroke, or death.  Pt verbalized understanding and agrees to proceed.   Becky Townsend Stable   For questions or updates, please contact [facility] Please consult [email or web address] for contact info under Cardiology/STEMI.  Signed, Becky Freer, PA-C  11/11/2022, 7:46 AM   I have seen and examined this patient with Becky Townsend.  Agree with above, note added to reflect my findings.  Patient post pacemaker implant.  Has pain in her chest when she takes a deep breath and lies flat.  Otherwise no complaint.  GEN: Well nourished, well developed, in no acute distress  HEENT: normal  Neck: no JVD, carotid bruits, or masses Cardiac: RRR; no murmurs, rubs, or gallops,no edema  Respiratory:  clear to auscultation bilaterally, normal work of breathing GI: soft, nontender, nondistended, + BS MS: no deformity or atrophy  Skin: warm and dry, device site well healed Neuro:  Strength and sensation are intact Psych: euthymic mood, full affect   Syncope with intermittent heart block:  Status post Medtronic pacemaker implant.  Chest x-ray without issue.  Unfortunately she is having what sounds to be pericardial pain.  Becky Townsend need lead revision.  This is likely the atrial lead as the ventricular lead is in the septum.  Becky Townsend plan for lead revision today.  Risk and benefits were discussed as above.  Becky Townsend M. Cylee Dattilo MD 11/11/2022 8:06 AM

## 2022-11-11 NOTE — Interval H&P Note (Signed)
History and Physical Interval Note:  11/11/2022 8:33 AM  Becky Townsend  has presented today for surgery, with the diagnosis of lead malfunction.  The various methods of treatment have been discussed with the patient and family. After consideration of risks, benefits and other options for treatment, the patient has consented to  Procedure(s): LEAD REVISION/REPAIR (N/A) as a surgical intervention.  The patient's history has been reviewed, patient examined, no change in status, stable for surgery.  I have reviewed the patient's chart and labs.  Questions were answered to the patient's satisfaction.     Nazaire Cordial [employer]

## 2022-11-11 NOTE — Progress Notes (Addendum)
Patient Name: Becky Townsend Date of Encounter: 11/11/2022  Primary Cardiologist: None Electrophysiologist: None  Interval Summary   Feeling OK this am but having a catch when she takes a deep breath, as well as pain on deep inspiration and lying flat.  Inpatient Medications    Scheduled Meds:  fluticasone  1 spray Each Nare Daily   insulin aspart  0-15 Units Subcutaneous TID WC   loratadine  10 mg Oral Daily   rosuvastatin  5 mg Oral Daily   Vitamin D (Ergocalciferol)  50,000 Units Oral Q Wed   Continuous Infusions:   ceFAZolin (ANCEF) IV 1 g (11/11/22 0204)   PRN Meds: acetaminophen, ibuprofen, ondansetron (ZOFRAN) IV   Vital Signs    Vitals:   11/10/22 1737 11/10/22 1927 11/11/22 0010 11/11/22 0426  BP:  128/77 104/68 94/64  Pulse: 89 88 87 85  Resp:  18 18 18  Temp:  97.6 F (36.4 C) 98 F (36.7 C) 98.2 F (36.8 C)  TempSrc:  Oral Oral Oral  SpO2: 100%  99% 98%  Weight:    73.9 kg  Height:       No intake or output data in the 24 hours ending 11/11/22 0746 Filed Weights   11/10/22 1150 11/11/22 0426  Weight: 72.6 kg 73.9 kg    Physical Exam    GEN- The patient is well appearing, alert and oriented x 3 today.   Lungs- Clear to ausculation bilaterally, normal work of breathing Cardiac- Regular rate and rhythm, no murmurs, rubs or gallops GI- soft, NT, ND, + BS Extremities- no clubbing or cyanosis. No edema  Telemetry    NSR with intermittent V pacing 70-90s (personally reviewed)  Hospital Course    Becky Townsend is a 51 y.o. female with a history of syncope, DM2, HTN, HLD, and anxiety who is being seen today for the evaluation of syncope and intermittent CHB at the request of Dr. Naasz.   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 Napoleon Monacelli Pleuritic chest pain started over night and  worsened, now with deep breathing and significant with lying flat.   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   Explained risks, benefits, and alternatives to lead revision, including but not limited to bleeding, infection, pneumothorax, pericardial effusion, lead dislodgement, heart attack, stroke, or death.  Pt verbalized understanding and agrees to proceed.   [name] Stable   For questions or updates, please contact [facility] Please consult [email or web address] for contact info under Cardiology/STEMI.  Signed, Michael Andrew Tillery, PA-C  11/11/2022, 7:46 AM   I have seen and examined this patient with Andy Tillery.  Agree with above, note added to reflect my findings.  Patient post pacemaker implant.  Has pain in her chest when she takes a deep breath and lies flat.  Otherwise no complaint.  GEN: Well nourished, well developed, in no acute distress  HEENT: normal  Neck: no JVD, carotid bruits, or masses Cardiac: RRR; no murmurs, rubs, or gallops,no edema  Respiratory:  clear to auscultation bilaterally, normal work of breathing GI: soft, nontender, nondistended, + BS MS: no deformity or atrophy  Skin: warm and dry, device site well healed Neuro:  Strength and sensation are intact Psych: euthymic mood, full affect   Syncope with intermittent heart block:   Status post Medtronic pacemaker implant.  Chest x-ray without issue.  Unfortunately she is having what sounds to be pericardial pain.  Francisco Ostrovsky need lead revision.  This is likely the atrial lead as the ventricular lead is in the septum.  Brave Dack plan for lead revision today.  Risk and benefits were discussed as above.  Chisom Aust M. Elfrieda Espino MD 11/11/2022 8:06 AM  

## 2022-11-11 NOTE — Discharge Summary (Incomplete)
ELECTROPHYSIOLOGY PROCEDURE DISCHARGE SUMMARY    Patient ID: Becky Townsend,  MRN: [id number], DOB/AGE: Feb 07, 1971 52 y.o.  Admit date: 11/10/2022 Discharge date: 11/12/2022  Primary Care Physician: [name], FNP  Primary Cardiologist: None  Electrophysiologist: Dr. Elberta Fortis   Primary Discharge Diagnosis:  Intermittent CHB status post pacemaker implantation this admission  Secondary Discharge Diagnosis:  Right atrial lead microperforation HTN DM2  Allergies  Allergen Reactions   Percocet [Oxycodone-Acetaminophen] Nausea And Vomiting   Sudafed [Pseudoephedrine]     Hives     Procedures This Admission:  1.  Implantation of a Medtronic Dual Chamber PPM on 11/10/22 by Dr. Elberta Fortis. The patient received a Medtronic Azure XT DR [id number]  with a Medtronic CapSureFix Novus 5076 right atrial lead and a Medtronic SelectSure 3830 right ventricular lead.  There were no immediate post procedure complications.   2.  CXR on 11/11/2022 demonstrated no pneumothorax status post device implantation.   3. Lead revision of a Medtronic 5076 right atrial lead on 11/11/2022 due to microperforation  4.  CXR on 11/12/2022 demonstrated no pneumothorax status post device revision.   Brief HPI: Becky Townsend is a 52 y.o. female was admitted for syncope and intermittent CHB on monitor and electrophysiology team asked to see for consideration of PPM implantation.  Past medical history includes above.  The patient has had AV block without reversible causes identified.  Risks, benefits, and alternatives to PPM implantation were reviewed with the patient who wished to proceed.   Hospital Course:  The patient was admitted and underwent implantation of a Medtronic dual chamber PPM with details as outlined above.  She was monitored on telemetry overnight which demonstrated appropriate pacing.  Left chest was without hematoma or ecchymosis.  The device was interrogated and found to be functioning  normally.  CXR was obtained and demonstrated no pneumothorax status post device implantation.  Pt did however develop pleuritic chest discomfort and was taken for revision of her right atrial lead 6/19. CXR on day of discharge was stable without pneumothorax. Wound care, arm mobility, and restrictions were reviewed with the patient.  The patient was examined and considered stable for discharge to home.    Anticoagulation resumption This patient is not on anticoagulation     Physical Exam: Vitals:   11/11/22 1830 11/11/22 1930 11/12/22 0022 11/12/22 0418  BP: 120/77 119/72 118/74 97/67  Pulse: 82 90 85   Resp:  16 18 18   Temp:  98.3 F (36.8 C) 97.7 F (36.5 C) 97.8 F (36.6 C)  TempSrc:  Oral Oral Oral  SpO2: 100% 99% 98% 98%  Weight:    73.6 kg  Height:        GEN- NAD. A&O x 3.  HEENT: Normocephalic, atraumatic Lungs- CTAB, Normal effort.  Heart- RRR, No M/G/R.  GI- Soft, NT, ND.  Extremities- No clubbing, cyanosis, or edema;  Skin- warm and dry, no rash or lesion, left chest without hematoma/ecchymosis  Discharge Medications:  Allergies as of 11/12/2022       Reactions   Percocet [oxycodone-acetaminophen] Nausea And Vomiting   Sudafed [pseudoephedrine]    Hives        Medication List     STOP taking these medications    lisinopril 5 MG tablet Commonly known as: ZESTRIL       TAKE these medications    acetaminophen 325 MG tablet Commonly known as: TYLENOL Take 1-2 tablets (325-650 mg total) by mouth every 4 (four) hours as needed for  mild pain.   blood glucose meter kit and supplies Dispense based on patient and insurance preference. Use up to four times daily as directed. (FOR ICD-10 E10.9, E11.9).   blood glucose meter kit and supplies E11 whatever insurance [place] pay for   Flonase Allergy Relief 50 MCG/ACT nasal spray Generic drug: fluticasone Place 1 spray into both nostrils daily.   ibuprofen 200 MG tablet Commonly known as: ADVIL Take 400 mg  by mouth every 6 (six) hours as needed for moderate pain.   loratadine 10 MG tablet Commonly known as: CLARITIN Take 10 mg by mouth daily.   ONE TOUCH ULTRA 2 w/Device Kit Use up to four times daily Dx E11.69   OneTouch Ultra test strip Generic drug: glucose blood Test BS daily Dx E11.69   onetouch ultrasoft lancets Test BS daily Dx E11.69   rosuvastatin 5 MG tablet Commonly known as: Crestor Take 1 tablet (5 mg total) by mouth daily.   UNABLE TO FIND Inject 1 Dose into the eye See admin instructions. Med Name: Eylea (aflibercept) eye injection every 6 weeks.   Vitamin D (Ergocalciferol) 1.25 MG (50000 UNIT) Caps capsule Commonly known as: DRISDOL Take 1 capsule (50,000 Units total) by mouth every 7 (seven) days for 12 doses.        Disposition:  Discharge Instructions     Increase activity slowly   Complete by: As directed    As directed in AVS       Follow-up Information     [facility] Follow up.   Specialty: Cardiology Why: on 7/3 at 1120 for post pacemaker check Contact information: [address], [address] [id number] mc [place] [address] [phone]                Duration of Discharge Encounter: Greater than 30 minutes including physician time.  Dustin Flock, PA-C  11/12/2022 9:02 AM  I have seen and examined this patient with [name].  Agree with above, note added to reflect my findings.  On exam, RRR, no murmurs.   She is now status post Medtronic pacemaker for intermittent heart block and syncope. Had microperfioration requiring lead revision.  Device functioning appropriately.  Chest x-ray and interrogation without issue.  Plan for discharge today with follow-up in device clinic.  Kaelum Kissick M. Jaretzy Lhommedieu MD 11/12/2022 5:06 PM

## 2022-11-11 NOTE — Discharge Instructions (Signed)
After Your Pacemaker   You have a Medtronic Pacemaker  ACTIVITY Do not lift your arm above shoulder height for 1 week after your procedure. After 7 days, you may progress as below.  You should remove your sling 24 hours after your procedure, unless otherwise instructed by your provider.     Wednesday November 18, 2022  Thursday November 19, 2022 Friday November 20, 2022 Saturday November 21, 2022   Do not lift, push, pull, or carry anything over 10 pounds with the affected arm until 6 weeks (Wednesday December 23, 2022 ) after your procedure.   You may drive AFTER your wound check, unless you have been told otherwise by your provider.   Ask your healthcare provider when you can go back to work   INCISION/Dressing If you are on a blood thinner such as Coumadin, Xarelto, Eliquis, Plavix, or Pradaxa please confirm with your provider when this should be resumed.   If large square, outer bandage is left in place, this can be removed after 24 hours from your procedure. Do not remove steri-strips or glue as below.   If a PRESSURE DRESSING (a bulky dressing that usually goes up over your shoulder) was applied or left in place, please follow instructions given by your provider on when to return to have this removed.   Monitor your Pacemaker site for redness, swelling, and drainage. Call the device clinic at [phone] if you experience these symptoms or fever/chills.  If your incision is sealed with Steri-strips or staples, you may shower 7 days after your procedure or when told by your provider. Do not remove the steri-strips or let the shower hit directly on your site. You may wash around your site with soap and water.    If you were discharged in a sling, please do not wear this during the day more than 48 hours after your surgery unless otherwise instructed. This may increase the risk of stiffness and soreness in your shoulder.   Avoid lotions, ointments, or perfumes over your incision until it is  well-healed.  You may use a hot tub or a pool AFTER your wound check appointment if the incision is completely closed.  Pacemaker Alerts:  Some alerts are vibratory and others beep. These are NOT emergencies. Please call our office to let us know. If this occurs at night or on weekends, it can wait until the next business day. Send a remote transmission.  If your device is capable of reading fluid status (for heart failure), you will be offered monthly monitoring to review this with you.   DEVICE MANAGEMENT Remote monitoring is used to monitor your pacemaker from home. This monitoring is scheduled every 91 days by our office. It allows [place] to keep an eye on the functioning of your device to ensure it is working properly. You will routinely see your Electrophysiologist annually (more often if necessary).   You should receive your ID card for your new device in 4-8 weeks. Keep this card with you at all times once received. Consider wearing a medical alert bracelet or necklace.  Your Pacemaker may be MRI compatible. This will be discussed at your next office visit/wound check.  You should avoid contact with strong electric or magnetic fields.   Do not use amateur (ham) radio equipment or electric (arc) welding torches. MP3 player headphones with magnets should not be used. Some devices are safe to use if held at least 12 inches (30 cm) from your Pacemaker. These include power tools, lawn  mowers, and speakers. If you are unsure if something is safe to use, ask your health care provider.  When using your cell phone, hold it to the ear that is on the opposite side from the Pacemaker. Do not leave your cell phone in a pocket over the Pacemaker.  You may safely use electric blankets, heating pads, computers, and microwave ovens.  Call the office right away if: You have chest pain. You feel more short of breath than you have felt before. You feel more light-headed than you have felt before. Your  incision starts to open up.  This information is not intended to replace advice given to you by your health care provider. Make sure you discuss any questions you have with your health care provider.

## 2022-11-12 ENCOUNTER — Inpatient Hospital Stay (HOSPITAL_COMMUNITY): Payer: 59

## 2022-11-12 ENCOUNTER — Encounter (HOSPITAL_COMMUNITY): Payer: Self-pay | Admitting: Cardiology

## 2022-11-12 LAB — LYME DISEASE SEROLOGY W/REFLEX: Lyme Total Antibody EIA: NEGATIVE

## 2022-11-12 LAB — GLUCOSE, CAPILLARY: Glucose-Capillary: 90 mg/dL (ref 70–99)

## 2022-11-12 MED ORDER — ACETAMINOPHEN 325 MG PO TABS: 325.0000 mg | ORAL_TABLET | ORAL | Status: AC | PRN

## 2022-11-12 NOTE — Plan of Care (Signed)
  Problem: Education: Goal: Knowledge of cardiac device and self-care will improve Outcome: Progressing   Problem: Education: Goal: Knowledge of General Education information will improve Description: Including pain rating scale, medication(s)/side effects and non-pharmacologic comfort measures Outcome: Progressing   Problem: Clinical Measurements: Goal: Respiratory complications will improve Outcome: Progressing   Problem: Coping: Goal: Level of anxiety will decrease Outcome: Progressing

## 2022-11-13 ENCOUNTER — Telehealth: Payer: Self-pay

## 2022-11-13 NOTE — Transitions of Care (Post Inpatient/ED Visit) (Unsigned)
   11/13/2022  Name: Becky Townsend MRN: [id number] DOB: November 13, 1970  Today's TOC FU Call Status: Today's TOC FU Call Status:: Unsuccessul Call (1st Attempt) Unsuccessful Call (1st Attempt) Date: 11/13/22  Attempted to reach the patient regarding the most recent Inpatient/ED visit.  Follow Up Plan: Additional outreach attempts will be made to reach the patient to complete the Transitions of Care (Post Inpatient/ED visit) call.   Signature Karena Addison, LPN [facility] Nurse Health Advisor Direct Dial [phone]

## 2022-11-16 NOTE — Transitions of Care (Post Inpatient/ED Visit) (Signed)
11/16/2022  Name: Becky Townsend MRN: [id number] DOB: 06/06/1970  Today's TOC FU Call Status: Today's TOC FU Call Status:: Unsuccessul Call (1st Attempt) Unsuccessful Call (1st Attempt) Date: 11/13/22 [facility] FU Call Complete Date: 11/16/22  Transition Care Management Follow-up Telephone Call Date of Discharge: 11/12/22 Discharge Facility: [facility]) Type of Discharge: Inpatient Admission Primary Inpatient Discharge Diagnosis:: syncope How have you been since you were released from the hospital?: Better Any questions or concerns?: No  Items Reviewed: Did you receive and understand the discharge instructions provided?: Yes Medications obtained,verified, and reconciled?: Yes (Medications Reviewed) Any new allergies since your discharge?: No Dietary orders reviewed?: Yes Do you have support at home?: Yes People in Home: spouse  Medications Reviewed Today: Medications Reviewed Today     Reviewed by Karena Addison, LPN (Licensed Practical Nurse) on 11/16/22 at 1659  Med List Status: <None>   Medication Order Taking? Sig Documenting Provider Last Dose Status Informant  acetaminophen (TYLENOL) 325 MG tablet [id number]  Take 1-2 tablets (325-650 mg total) by mouth every 4 (four) hours as needed for mild pain. [name], PA-C  Active   blood glucose meter kit and supplies [id number] No Dispense based on patient and insurance preference. Use up to four times daily as directed. (FOR ICD-10 E10.9, E11.9). [name], FNP Taking Active Self, Pharmacy Records  blood glucose meter kit and supplies [id number] No E11 whatever insurance will pay for [name], FNP Taking Active Self, Pharmacy Records  Blood Glucose Monitoring Suppl (ONE TOUCH ULTRA 2) w/Device KIT [id number] No Use up to four times daily Dx E11.69 [name], FNP Taking Active Self, Pharmacy Records  fluticasone [facility] ALLERGY RELIEF) 50 MCG/ACT nasal spray [id number] No Place 1 spray into both  nostrils daily. [provider] 11/09/2022 Active Self, Pharmacy Records  glucose blood [facility] ULTRA) test strip [id number] No Test BS daily Dx E11.69 [name], FNP Taking Active Self, Pharmacy Records  ibuprofen (ADVIL) 200 MG tablet [id number] No Take 400 mg by mouth every 6 (six) hours as needed for moderate pain. [provider] 11/09/2022 Active Self, Pharmacy Records  Lancets [facility] ULTRASOFT) lancets [id number] No Test BS daily Dx E11.69 [name], FNP Taking Active Self, Pharmacy Records  loratadine (CLARITIN) 10 MG tablet [id number] No Take 10 mg by mouth daily. [provider] 11/09/2022 Active Self, Pharmacy Records  rosuvastatin (CRESTOR) 5 MG tablet [id number] No Take 1 tablet (5 mg total) by mouth daily. [name], FNP 11/09/2022 Active Self, Pharmacy Records  UNABLE TO FIND [id number] No Inject 1 Dose into the eye See admin instructions. Med Name: Eylea (aflibercept) eye injection every 6 weeks. [provider] Taking Active Self, Pharmacy Records  Vitamin D, Ergocalciferol, (DRISDOL) 1.25 MG (50000 UNIT) CAPS capsule [id number] No Take 1 capsule (50,000 Units total) by mouth every 7 (seven) days for 12 doses. [name], FNP Past Week Designer, multimedia, Pharmacy Records            Home Care and Equipment/Supplies: Were Home Health Services Ordered?: NA Any new equipment or medical supplies ordered?: NA  Functional Questionnaire: Do you need assistance with bathing/showering or dressing?: No Do you need assistance with meal preparation?: No Do you need assistance with eating?: No Do you have difficulty maintaining continence: No Do you need assistance with getting out of bed/getting out of a chair/moving?: No Do you have difficulty managing or taking your medications?: No  Follow up appointments reviewed: PCP Follow-up appointment  confirmed?: Yes Date of PCP follow-up appointment?: 11/18/22 Follow-up Provider:  [facility] Follow-up appointment confirmed?: NA Do you need transportation to your follow-up appointment?: No Do you understand care options if your condition(s) worsen?: Yes-patient verbalized understanding    SIGNATURE Karena Addison, LPN [facility] Nurse Health Advisor Direct Dial [phone]

## 2022-11-17 ENCOUNTER — Encounter: Payer: Self-pay | Admitting: Family

## 2022-11-17 ENCOUNTER — Ambulatory Visit (INDEPENDENT_AMBULATORY_CARE_PROVIDER_SITE_OTHER): Payer: 59 | Admitting: Family

## 2022-11-17 VITALS — BP 131/81 | HR 89 | Temp 97.6°F | Ht 61.0 in | Wt 168.0 lb

## 2022-11-17 DIAGNOSIS — Z09 Encounter for follow-up examination after completed treatment for conditions other than malignant neoplasm: Secondary | ICD-10-CM

## 2022-11-17 DIAGNOSIS — I10 Essential (primary) hypertension: Secondary | ICD-10-CM | POA: Diagnosis not present

## 2022-11-17 DIAGNOSIS — E1169 Type 2 diabetes mellitus with other specified complication: Secondary | ICD-10-CM | POA: Diagnosis not present

## 2022-11-17 DIAGNOSIS — I459 Conduction disorder, unspecified: Secondary | ICD-10-CM | POA: Diagnosis not present

## 2022-11-17 NOTE — Progress Notes (Signed)
Subjective:    Patient ID: Becky Townsend, female    DOB: Apr 09, 1971, 52 y.o.   MRN: [id number]   Chief Complaint  Patient presents with   Hospitalization Follow-up    11/10/2022 - 11/12/2022 (2 days) [facility]- heart block    Today's visit was for Transitional Care Management.  The patient was discharged from [facility] on 11/12/22 with a primary diagnosis of heart block.   Contact with the patient and/or caregiver, by a clinical staff member, was made on 11/16/22 and was documented as a telephone encounter within the EMR.  Through chart review and discussion with the patient I have determined that management of their condition is of moderate complexity.   PT was having multiple syncope episodes. She went to ED and diagnosed with heart block. She has pacemaker placed 11/10/22 and had a lead revision on 11/11/22.   She has Cardiologist follow up on 11/25/22.  Hypertension This is a chronic problem. The current episode started more than 1 year ago. The problem has been waxing and waning since onset. The problem is uncontrolled. Pertinent negatives include no blurred vision, malaise/fatigue, peripheral edema or shortness of breath.  Diabetes She presents for her follow-up diabetic visit. She has type 2 diabetes mellitus. Pertinent negatives for diabetes include no blurred vision and no foot paresthesias. Diabetic complications include heart disease and peripheral neuropathy. Pertinent negatives for diabetic complications include no nephropathy. Risk factors for coronary artery disease include diabetes mellitus, dyslipidemia, hypertension and sedentary lifestyle. Her overall blood glucose range is 110-130 mg/dl.      Review of Systems  Constitutional:  Negative for malaise/fatigue.  Eyes:  Negative for blurred vision.  Respiratory:  Negative for shortness of breath.   All other systems reviewed and are negative.  Family History  Problem Relation Age of Onset    Hypertension Father    Breast cancer Neg Hx    Social History   Socioeconomic History   Marital status: Married    Spouse name: Not on file   Number of children: Not on file   Years of education: Not on file   Highest education level: Not on file  Occupational History   Not on file  Tobacco Use   Smoking status: Never   Smokeless tobacco: Never  Vaping Use   Vaping Use: Never used  Substance and Sexual Activity   Alcohol use: Not Currently    Comment: social only not often at all   Drug use: Never   Sexual activity: Yes    Birth control/protection: None  Other Topics Concern   Not on file  Social History Narrative   Right handed   Lives at home with husband    Caffeine- none   Social Determinants of Health   Financial Resource Strain: Not on file  Food Insecurity: No Food Insecurity (10/05/2022)   Hunger Vital Sign    Worried About Running Out of Food in the Last Year: Never true    Ran Out of Food in the Last Year: Never true  Transportation Needs: No Transportation Needs (10/05/2022)   PRAPARE - Administrator, Civil Service (Medical): No    Lack of Transportation (Non-Medical): No  Physical Activity: Not on file  Stress: Not on file  Social Connections: Not on file       Objective:   Physical Exam Vitals reviewed.  Constitutional:      General: She is not in acute distress.    Appearance: She  is well-developed.  HENT:     Head: Normocephalic and atraumatic.     Right Ear: Tympanic membrane normal.     Left Ear: Tympanic membrane normal.  Eyes:     Pupils: Pupils are equal, round, and reactive to light.  Neck:     Thyroid: No thyromegaly.  Cardiovascular:     Rate and Rhythm: Normal rate and regular rhythm.     Heart sounds: Normal heart sounds. No murmur heard. Pulmonary:     Effort: Pulmonary effort is normal. No respiratory distress.     Breath sounds: Normal breath sounds. No stridor. No wheezing.  Abdominal:     General: Bowel  sounds are normal. There is no distension.     Palpations: Abdomen is soft.     Tenderness: There is no abdominal tenderness.  Musculoskeletal:        General: Deformity (right foot) present. No tenderness. Normal range of motion.     Cervical back: Normal range of motion and neck supple.  Skin:    General: Skin is warm and dry.  Neurological:     Mental Status: She is alert and oriented to person, place, and time.     Cranial Nerves: No cranial nerve deficit.     Deep Tendon Reflexes: Reflexes are normal and symmetric.  Psychiatric:        Behavior: Behavior normal.        Thought Content: Thought content normal.        Judgment: Judgment normal.     BP 131/81   Pulse 89   Temp 97.6 F (36.4 C) (Temporal)   Ht 5\' 1"  (1.549 m)   Wt 168 lb (76.2 kg)   SpO2 97%   BMI 31.74 kg/m      Assessment & Plan:  SAMYIAH HALVORSEN comes in today with chief complaint of Hospitalization Follow-up (11/10/2022 - 11/12/2022 (2 days)/[facility]- heart block )   Diagnosis and orders addressed:  1. Primary hypertension - CBC with Differential/Platelet - CMP14+EGFR  2. Heart block - CBC with Differential/Platelet - CMP14+EGFR  3. Type 2 diabetes mellitus with other specified complication, without long-term current use of insulin (HCC)  - CBC with Differential/Platelet - CMP14+EGFR  4. Hospital discharge follow-up    Labs pending Continue medication  Can restart light exercise  Health Maintenance reviewed Diet and exercise encouraged  Follow up plan: Keep chronic follow up and Cardiologists follow up

## 2022-11-17 NOTE — Patient Instructions (Signed)
Pacemaker Implantation, Adult Pacemaker implantation is a procedure to put a pacemaker in the chest. A pacemaker is a small computer that sends electrical signals to the heart. This helps the heart beat normally. Some pacemakers keep information about heart rhythms. You may need this procedure if you have: A slow heartbeat (bradycardia). Repeated fainting (syncope), or you often have dizziness or light-headedness because of an irregular heart rate. A weak heart that is not pumping enough blood to your body. The pacemaker can help your heart chambers beat in sync. The pacemaker attaches to your heart with a wire called a lead. One or two leads may be needed. There are different types of pacemakers: Transvenous pacemaker. This type is placed under the skin or muscle of your upper chest. The lead goes through a vein in the chest to the inside of the heart. Epicardial pacemaker. This type is placed under the skin or muscle of your chest or belly. The lead goes through your chest to the outside of the heart. Tell a health care provider about: Any allergies you have. All medicines you are taking, including vitamins, herbs, eye drops, creams, and over-the-counter medicines. Any problems you or family members have had with anesthesia. Any bleeding problems or bone disorders you have. Any surgeries you have had. Any medical conditions you have. Whether you are pregnant or may be pregnant. What are the risks? Your health care provider will talk with you about risks. These may include: Infection. Bleeding. Failure of the pacemaker or lead. A collapsed lung or bleeding into a lung. A blood clot inside a blood vessel with a lead. Heart damage. Infection inside the heart (endocarditis). Allergic reactions to medicines. What happens before the procedure? When to stop eating and drinking Follow instructions from your provider about what you may eat and drink. These may include: 8 hours before your  procedure Stop eating most foods. Do not eat meat, fried foods, or fatty foods. Eat only light foods, such as toast or crackers. All liquids are okay except energy drinks and alcohol. 6 hours before your procedure Stop eating. Drink only clear liquids, such as water, clear fruit juice, black coffee, plain tea, and sports drinks. Do not drink energy drinks or alcohol. 2 hours before your procedure Stop drinking all liquids. You may be allowed to take medicines with small sips of water. If you do not follow your provider's instructions, your procedure may be delayed or canceled. Medicines Ask your provider about: Changing or stopping your regular medicines. These include any diabetes medicines or blood thinners you take. Taking medicines such as aspirin and ibuprofen. These medicines can thin your blood. Do not take them unless your provider tells you to. Taking over-the-counter medicines, vitamins, herbs, and supplements. Tests You may have: A heart test. This may include: An electrocardiogram (ECG). Patches will be put on your skin to check your heart rhythm. A chest X-ray. An echocardiogram. Sound waves (ultrasound) are used to get an image of the heart. A cardiac rhythm monitor. This records your heart rhythm and any events for a longer period of time. Blood tests. Genetic testing. General instructions Do not use any products that contain nicotine or tobacco. These products include cigarettes, chewing tobacco, and vaping devices, such as e-cigarettes. If you need help quitting, ask your provider. Ask your provider: How your surgery site will be marked. What steps will be taken to help prevent infection. These steps may include: Removing hair at the surgery site. Washing skin with a soap that  kills germs. Receiving antibiotics. If you will be going home right after the procedure, plan to have a responsible adult: Take you home from the hospital or clinic. You will not be allowed  to drive. Care for you for the time you are told. What happens during the procedure? An IV will be inserted into one of your veins. You may be given: A sedative. This helps you relax. Anesthesia. This keeps you from feeling pain. It will make you fall asleep for surgery. The next steps vary depending on which pacemaker you will be getting. If you are getting a transvenous pacemaker: An incision will be made in your upper chest. A pocket will be made for the pacemaker. It may be placed under the skin or between layers of muscle. The lead will be put into a blood vessel that goes to the heart. While X-rays are taken by an imaging machine (fluoroscopy), the lead will be moved through the vein to the inside of your heart. The other end of the lead will be put under the skin and attached to the pacemaker. If you are getting an epicardial pacemaker: An incision will be made near your ribs or breastbone (sternum) for the lead. The lead will be attached to the outside of your heart. Another incision will be made in your chest or upper belly to make a pocket for the pacemaker. The free end of the lead will be moved under the skin and attached to the pacemaker. The pacemaker will be tested. Pictures may be taken to check the lead position. The incisions will be closed with stitches (sutures), tape strips, or skin glue. Bandages (dressings) will be placed over the incisions. The procedure may vary among providers and hospitals. What happens after the procedure? Your blood pressure, heart rate, breathing rate, and blood oxygen level will be monitored until you leave the hospital or clinic. You may be given antibiotics. You will be given pain medicine. An ECG and chest X-rays will be done. Your provider will program the pacemaker. If you were given a sedative during the procedure, it can affect you for several hours. Do not drive or operate machinery until your provider says that it is safe. You  will be given a pacemaker identification card. This card lists the implant date, device model, and manufacturer of your pacemaker. This information is not intended to replace advice given to you by your health care provider. Make sure you discuss any questions you have with your health care provider. Document Revised: 03/20/2022 Document Reviewed: 03/20/2022 Elsevier Patient Education  2024 [employer].

## 2022-11-18 LAB — CMP14+EGFR
ALT: 31 IU/L (ref 0–32)
AST: 30 IU/L (ref 0–40)
Albumin: 4.5 g/dL (ref 3.8–4.9)
Alkaline Phosphatase: 68 IU/L (ref 44–121)
BUN/Creatinine Ratio: 47 — ABNORMAL HIGH (ref 9–23)
BUN: 33 mg/dL — ABNORMAL HIGH (ref 6–24)
Bilirubin Total: 0.4 mg/dL (ref 0.0–1.2)
CO2: 24 mmol/L (ref 20–29)
Calcium: 9.5 mg/dL (ref 8.7–10.2)
Chloride: 104 mmol/L (ref 96–106)
Creatinine, Ser: 0.7 mg/dL (ref 0.57–1.00)
Globulin, Total: 2.1 g/dL (ref 1.5–4.5)
Glucose: 124 mg/dL — ABNORMAL HIGH (ref 70–99)
Potassium: 4.2 mmol/L (ref 3.5–5.2)
Sodium: 141 mmol/L (ref 134–144)
Total Protein: 6.6 g/dL (ref 6.0–8.5)
eGFR: 103 mL/min/{1.73_m2} (ref >59)

## 2022-11-18 LAB — CBC WITH DIFFERENTIAL/PLATELET
Basophils Absolute: 0.1 10*3/uL (ref 0.0–0.2)
Basos: 1 %
EOS (ABSOLUTE): 0.5 10*3/uL — ABNORMAL HIGH (ref 0.0–0.4)
Eos: 7 %
Hematocrit: 36.2 % (ref 34.0–46.6)
Hemoglobin: 11.8 g/dL (ref 11.1–15.9)
Immature Grans (Abs): 0 10*3/uL (ref 0.0–0.1)
Immature Granulocytes: 0 %
Lymphocytes Absolute: 1.7 10*3/uL (ref 0.7–3.1)
Lymphs: 24 %
MCH: 29.6 pg (ref 26.6–33.0)
MCHC: 32.6 g/dL (ref 31.5–35.7)
MCV: 91 fL (ref 79–97)
Monocytes Absolute: 0.4 10*3/uL (ref 0.1–0.9)
Monocytes: 6 %
Neutrophils Absolute: 4.2 10*3/uL (ref 1.4–7.0)
Neutrophils: 62 %
Platelets: 178 10*3/uL (ref 150–450)
RBC: 3.98 x10E6/uL (ref 3.77–5.28)
RDW: 13.1 % (ref 11.7–15.4)
WBC: 6.9 10*3/uL (ref 3.4–10.8)

## 2022-11-25 ENCOUNTER — Ambulatory Visit: Payer: 59 | Admitting: Cardiology

## 2022-11-25 ENCOUNTER — Ambulatory Visit: Payer: 59 | Attending: Internal Medicine

## 2022-11-25 DIAGNOSIS — I442 Atrioventricular block, complete: Secondary | ICD-10-CM | POA: Diagnosis not present

## 2022-11-25 LAB — CUP PACEART INCLINIC DEVICE CHECK
Battery Remaining Longevity: 179 mo
Battery Voltage: 3.19 V
Brady Statistic AP VP Percent: 0.01 %
Brady Statistic AP VS Percent: 0.01 %
Brady Statistic AS VP Percent: 0.14 %
Brady Statistic AS VS Percent: 99.85 %
Brady Statistic RA Percent Paced: 0.01 %
Brady Statistic RV Percent Paced: 0.15 %
Date Time Interrogation Session: 20240703172612
Implantable Lead Connection Status: 753985
Implantable Lead Connection Status: 753985
Implantable Lead Implant Date: 20240618
Implantable Lead Implant Date: 20240619
Implantable Lead Location: 753859
Implantable Lead Location: 753860
Implantable Lead Model: 3830
Implantable Lead Model: 5076
Implantable Pulse Generator Implant Date: 20240618
Lead Channel Impedance Value: 285 Ohm
Lead Channel Impedance Value: 361 Ohm
Lead Channel Impedance Value: 380 Ohm
Lead Channel Impedance Value: 627 Ohm
Lead Channel Pacing Threshold Amplitude: 0.5 V
Lead Channel Pacing Threshold Amplitude: 0.75 V
Lead Channel Pacing Threshold Pulse Width: 0.4 ms
Lead Channel Pacing Threshold Pulse Width: 0.4 ms
Lead Channel Sensing Intrinsic Amplitude: 15 mV
Lead Channel Sensing Intrinsic Amplitude: 17.75 mV
Lead Channel Sensing Intrinsic Amplitude: 2.25 mV
Lead Channel Sensing Intrinsic Amplitude: 2.5 mV
Lead Channel Setting Pacing Amplitude: 3.5 V
Lead Channel Setting Pacing Amplitude: 3.5 V
Lead Channel Setting Pacing Pulse Width: 0.4 ms
Lead Channel Setting Sensing Sensitivity: 1.2 mV
Zone Setting Status: 755011

## 2022-11-25 NOTE — Progress Notes (Signed)
Wound check appointment. Steri-strips removed. Wound without redness or edema. Incision edges approximated, wound well healed. Normal device function. Thresholds, sensing, and impedances consistent with implant measurements. Device programmed at 3.5V/auto capture programmed on for extra safety margin until 3 month visit. Histogram distribution appropriate for patient and level of activity. No mode switches or high ventricular rates noted. Patient educated about wound care, arm mobility, lifting restrictions. ROV in 3 months with implanting physician, sooner if needed.  Patient reports occasional episodes of feeling near syncopal events, same as before her device only this time she doesn't actually pass out.  Describes symptoms as a brief period of fluttering, palpitations, tickling in throat accompanied by brief tunnel vision.  Device did not record anything to correlate with her symptoms.  She has been asked to keep a record of her orthostatic blood pressures and follow up with [place] and her PCP if episode occurs again.  We will have her send [place] a manual transmission at time of episode to evaluate further.  She has device clinic  number if any concerns and ER instructions if any urgent, severe symptoms.

## 2022-11-25 NOTE — Patient Instructions (Signed)
   After Your Pacemaker   Monitor your pacemaker site for redness, swelling, and drainage. Call the device clinic at [phone] if you experience these symptoms or fever/chills.  Your incision was closed with Steri-strips or staples:  You may shower 7 days after your procedure and wash your incision with soap and water. Avoid lotions, ointments, or perfumes over your incision until it is well-healed.  You may use a hot tub or a pool after your wound check appointment if the incision is completely closed.  Do not lift, push or pull greater than 10 pounds with the affected arm until 6 weeks after your procedure. UNTIL AFTER JUNE 30TH.There are no other restrictions in arm movement after your wound check appointment.  You may drive, unless driving has been restricted by your healthcare providers.  Remote monitoring is used to monitor your pacemaker from home. This monitoring is scheduled every 91 days by our office. It allows [place] to keep an eye on the functioning of your device to ensure it is working properly. You will routinely see your Electrophysiologist annually (more often if necessary).

## 2022-11-27 ENCOUNTER — Ambulatory Visit: Payer: 59 | Admitting: Cardiology

## 2022-12-21 ENCOUNTER — Telehealth: Payer: Self-pay

## 2022-12-21 NOTE — Telephone Encounter (Signed)
Pt left a voicemail stating she sent a transmission Saturday. She had low heart rate and felt dizzy. Transmission received 12/19/2022.

## 2022-12-21 NOTE — Telephone Encounter (Signed)
Patient reports dizziness Saturday while cleaning.  Checked BP: 77/50, HR: 95 bpm. Sat down felt better. Advised patient to follow up with PCP to advise BP and concerning symptoms. Advised remote transmission findings are WNL. Patient appreciative of call and agreeable with plan.

## 2023-01-07 ENCOUNTER — Encounter: Payer: Self-pay | Admitting: Family

## 2023-01-07 ENCOUNTER — Ambulatory Visit (INDEPENDENT_AMBULATORY_CARE_PROVIDER_SITE_OTHER): Payer: 59 | Admitting: Family

## 2023-01-07 VITALS — BP 137/82 | HR 91 | Temp 97.5°F | Ht 61.0 in | Wt 168.2 lb

## 2023-01-07 DIAGNOSIS — M14679 Charcot's joint, unspecified ankle and foot: Secondary | ICD-10-CM | POA: Diagnosis not present

## 2023-01-07 DIAGNOSIS — Z0000 Encounter for general adult medical examination without abnormal findings: Secondary | ICD-10-CM

## 2023-01-07 DIAGNOSIS — K5900 Constipation, unspecified: Secondary | ICD-10-CM | POA: Diagnosis not present

## 2023-01-07 DIAGNOSIS — I10 Essential (primary) hypertension: Secondary | ICD-10-CM

## 2023-01-07 DIAGNOSIS — Z0001 Encounter for general adult medical examination with abnormal findings: Secondary | ICD-10-CM | POA: Diagnosis not present

## 2023-01-07 DIAGNOSIS — Z1159 Encounter for screening for other viral diseases: Secondary | ICD-10-CM

## 2023-01-07 DIAGNOSIS — Z1211 Encounter for screening for malignant neoplasm of colon: Secondary | ICD-10-CM

## 2023-01-07 DIAGNOSIS — E1169 Type 2 diabetes mellitus with other specified complication: Secondary | ICD-10-CM

## 2023-01-07 DIAGNOSIS — E785 Hyperlipidemia, unspecified: Secondary | ICD-10-CM

## 2023-01-07 DIAGNOSIS — I459 Conduction disorder, unspecified: Secondary | ICD-10-CM

## 2023-01-07 LAB — BAYER DCA HB A1C WAIVED: HB A1C (BAYER DCA - WAIVED): 5.2 % (ref 4.8–5.6)

## 2023-01-07 NOTE — Patient Instructions (Signed)

## 2023-01-07 NOTE — Progress Notes (Signed)
Subjective:    Patient ID: Becky Townsend, female    DOB: [birth year], 52 y.o.   MRN: [id number]  Chief Complaint  Patient presents with   Medical Management of Chronic Issues   Pt presents to the office today for CPE and chronic follow up. She has been diagnosed charcot of right foot. She has seen Ortho and is in a boot.   She is followed by Cardiologists every 6 months for heart block and has pacemaker in place.     She is a diabetic, but since finding out she has changed her diet. She has lost 53 lbs since Feb 2023.     01/07/2023    3:16 PM 11/17/2022   11:57 AM 11/12/2022    4:18 AM  Last 3 Weights  Weight (lbs) 168 lb 3.2 oz 168 lb 162 lb 4.8 oz  Weight (kg) 76.295 kg 76.204 kg 73.619 kg     Hypertension This is a chronic problem. The current episode started more than 1 year ago. The problem has been resolved since onset. The problem is controlled. Pertinent negatives include no blurred vision, malaise/fatigue, peripheral edema or shortness of breath. Risk factors for coronary artery disease include dyslipidemia, diabetes mellitus, obesity and sedentary lifestyle. The current treatment provides moderate improvement.  Hyperlipidemia This is a chronic problem. The current episode started more than 1 year ago. The problem is uncontrolled. Recent lipid tests were reviewed and are high. Exacerbating diseases include obesity. Pertinent negatives include no shortness of breath. Current antihyperlipidemic treatment includes statins. The current treatment provides moderate improvement of lipids. Risk factors for coronary artery disease include diabetes mellitus, dyslipidemia, hypertension and a sedentary lifestyle.  Diabetes She presents for her follow-up diabetic visit. She has type 2 diabetes mellitus. Pertinent negatives for diabetes include no blurred vision. Diabetic complications include heart disease. Risk factors for coronary artery disease include diabetes mellitus, dyslipidemia,  hypertension, sedentary lifestyle and post-menopausal. She is following a generally healthy and diabetic diet. Her overall blood glucose range is 90-110 mg/dl.  Constipation This is a chronic problem. The current episode started more than 1 year ago. The problem has been resolved since onset. Her stool frequency is 1 time per day. The treatment provided moderate relief.      Review of Systems  Constitutional:  Negative for malaise/fatigue.  Eyes:  Negative for blurred vision.  Respiratory:  Negative for shortness of breath.   Gastrointestinal:  Positive for constipation.  All other systems reviewed and are negative.  Family History  Problem Relation Age of Onset   Hypertension Father    Breast cancer Neg Hx    Social History   Socioeconomic History   Marital status: Married    Spouse name: Not on file   Number of children: Not on file   Years of education: Not on file   Highest education level: Not on file  Occupational History   Not on file  Tobacco Use   Smoking status: Never   Smokeless tobacco: Never  Vaping Use   Vaping status: Never Used  Substance and Sexual Activity   Alcohol use: Not Currently    Comment: social only not often at all   Drug use: Never   Sexual activity: Yes    Birth control/protection: None  Other Topics Concern   Not on file  Social History Narrative   Right handed   Lives at home with husband    Caffeine- none   Social Determinants of Corporate investment banker  Strain: Not on file  Food Insecurity: No Food Insecurity (10/05/2022)   Hunger Vital Sign    Worried About Running Out of Food in the Last Year: Never true    Ran Out of Food in the Last Year: Never true  Transportation Needs: No Transportation Needs (10/05/2022)   PRAPARE - Administrator, Civil Service (Medical): No    Lack of Transportation (Non-Medical): No  Physical Activity: Not on file  Stress: Not on file  Social Connections: Not on file  Intimate  Partner Violence: Not At Risk (10/05/2022)   Humiliation, Afraid, Rape, and Kick questionnaire    Fear of Current or Ex-Partner: No    Emotionally Abused: No    Physically Abused: No    Sexually Abused: No       Objective:   Physical Exam Vitals reviewed.  Constitutional:      General: She is not in acute distress.    Appearance: She is well-developed. She is obese.  HENT:     Head: Normocephalic and atraumatic.     Right Ear: Tympanic membrane normal.     Left Ear: Tympanic membrane normal.  Eyes:     Pupils: Pupils are equal, round, and reactive to light.  Neck:     Thyroid: No thyromegaly.  Cardiovascular:     Rate and Rhythm: Normal rate and regular rhythm.     Heart sounds: Normal heart sounds. No murmur heard. Pulmonary:     Effort: Pulmonary effort is normal. No respiratory distress.     Breath sounds: Normal breath sounds. No wheezing.  Abdominal:     General: Bowel sounds are normal. There is no distension.     Palpations: Abdomen is soft.     Tenderness: There is no abdominal tenderness.  Musculoskeletal:        General: Tenderness and deformity (left boot on foot) present. Normal range of motion.     Cervical back: Normal range of motion and neck supple.  Skin:    General: Skin is warm and dry.  Neurological:     Mental Status: She is alert and oriented to person, place, and time.     Cranial Nerves: No cranial nerve deficit.     Deep Tendon Reflexes: Reflexes are normal and symmetric.  Psychiatric:        Behavior: Behavior normal.        Thought Content: Thought content normal.        Judgment: Judgment normal.      BP 137/82   Pulse 91   Temp (!) 97.5 F (36.4 C) (Temporal)   Ht 5\' 1"  (1.549 m)   Wt 168 lb 3.2 oz (76.3 kg)   SpO2 95%   BMI 31.78 kg/m      Assessment & Plan:  Becky Townsend comes in today with chief complaint of Medical Management of Chronic Issues   Diagnosis and orders addressed:  1. Annual physical exam - CBC with  Differential/Platelet - CMP14+EGFR - Lipid panel - TSH - Hepatitis C antibody  2. Charcot arthropathy of midfoot - CBC with Differential/Platelet - CMP14+EGFR  3. Constipation, unspecified constipation type - CBC with Differential/Platelet - CMP14+EGFR  4. Type 2 diabetes mellitus with other specified complication, without long-term current use of insulin (HCC) - CBC with Differential/Platelet - CMP14+EGFR - Bayer DCA Hb A1c Waived  5. Heart block - CBC with Differential/Platelet - CMP14+EGFR  6. Primary hypertension - CBC with Differential/Platelet - CMP14+EGFR  7. Hyperlipidemia associated with type  2 diabetes mellitus (HCC) - CBC with Differential/Platelet - CMP14+EGFR  8. Colon cancer screening - Cologuard - CBC with Differential/Platelet - CMP14+EGFR  9. Need for hepatitis C screening test - CMP14+EGFR - Hepatitis C antibody   Labs pending Patient reviewed in [place] controlled database, no flags noted. Contract and drug screen are up to date.  Continue current medications  Health Maintenance reviewed Diet and exercise encouraged  Follow up plan: 4 months    Jannifer Rodney, FNP

## 2023-01-08 LAB — CBC WITH DIFFERENTIAL/PLATELET
Basophils Absolute: 0.1 10*3/uL (ref 0.0–0.2)
Basos: 1 %
EOS (ABSOLUTE): 0.3 10*3/uL (ref 0.0–0.4)
Eos: 4 %
Hematocrit: 37.5 % (ref 34.0–46.6)
Hemoglobin: 12.7 g/dL (ref 11.1–15.9)
Immature Grans (Abs): 0 10*3/uL (ref 0.0–0.1)
Immature Granulocytes: 0 %
Lymphocytes Absolute: 1.7 10*3/uL (ref 0.7–3.1)
Lymphs: 27 %
MCH: 30.2 pg (ref 26.6–33.0)
MCHC: 33.9 g/dL (ref 31.5–35.7)
MCV: 89 fL (ref 79–97)
Monocytes Absolute: 0.4 10*3/uL (ref 0.1–0.9)
Monocytes: 6 %
Neutrophils Absolute: 4 10*3/uL (ref 1.4–7.0)
Neutrophils: 62 %
Platelets: 189 10*3/uL (ref 150–450)
RBC: 4.2 x10E6/uL (ref 3.77–5.28)
RDW: 12.9 % (ref 11.7–15.4)
WBC: 6.4 10*3/uL (ref 3.4–10.8)

## 2023-01-08 LAB — CMP14+EGFR
ALT: 33 IU/L — ABNORMAL HIGH (ref 0–32)
AST: 25 IU/L (ref 0–40)
Albumin: 4.3 g/dL (ref 3.8–4.9)
Alkaline Phosphatase: 66 IU/L (ref 44–121)
BUN/Creatinine Ratio: 41 — ABNORMAL HIGH (ref 9–23)
BUN: 32 mg/dL — ABNORMAL HIGH (ref 6–24)
Bilirubin Total: 0.5 mg/dL (ref 0.0–1.2)
CO2: 22 mmol/L (ref 20–29)
Calcium: 9.7 mg/dL (ref 8.7–10.2)
Chloride: 106 mmol/L (ref 96–106)
Creatinine, Ser: 0.79 mg/dL (ref 0.57–1.00)
Globulin, Total: 2.4 g/dL (ref 1.5–4.5)
Glucose: 113 mg/dL — ABNORMAL HIGH (ref 70–99)
Potassium: 4.2 mmol/L (ref 3.5–5.2)
Sodium: 143 mmol/L (ref 134–144)
Total Protein: 6.7 g/dL (ref 6.0–8.5)
eGFR: 89 mL/min/{1.73_m2} (ref >59)

## 2023-01-08 LAB — HEPATITIS C ANTIBODY: Hep C Virus Ab: NONREACTIVE

## 2023-01-08 LAB — LIPID PANEL
Chol/HDL Ratio: 2.8 ratio (ref 0.0–4.4)
Cholesterol, Total: 168 mg/dL (ref 100–199)
HDL: 59 mg/dL (ref >39)
LDL Chol Calc (NIH): 94 mg/dL (ref 0–99)
Triglycerides: 77 mg/dL (ref 0–149)
VLDL Cholesterol Cal: 15 mg/dL (ref 5–40)

## 2023-01-08 LAB — TSH: TSH: 1.1 u[IU]/mL (ref 0.450–4.500)

## 2023-01-16 ENCOUNTER — Other Ambulatory Visit: Payer: Self-pay | Admitting: Family

## 2023-02-12 ENCOUNTER — Other Ambulatory Visit: Payer: Self-pay | Admitting: Family

## 2023-02-12 DIAGNOSIS — R195 Other fecal abnormalities: Secondary | ICD-10-CM

## 2023-02-12 LAB — COLOGUARD: COLOGUARD: POSITIVE — AB

## 2023-02-16 ENCOUNTER — Ambulatory Visit: Payer: 59

## 2023-02-16 DIAGNOSIS — I442 Atrioventricular block, complete: Secondary | ICD-10-CM

## 2023-02-16 LAB — CUP PACEART REMOTE DEVICE CHECK
Battery Remaining Longevity: 178 mo
Battery Voltage: 3.18 V
Brady Statistic AP VP Percent: 0 %
Brady Statistic AP VS Percent: 0 %
Brady Statistic AS VP Percent: 0.05 %
Brady Statistic AS VS Percent: 99.94 %
Brady Statistic RA Percent Paced: 0.01 %
Brady Statistic RV Percent Paced: 0.05 %
Date Time Interrogation Session: 20240923221112
Implantable Lead Connection Status: 753985
Implantable Lead Connection Status: 753985
Implantable Lead Implant Date: 20240618
Implantable Lead Implant Date: 20240619
Implantable Lead Location: 753859
Implantable Lead Location: 753860
Implantable Lead Model: 3830
Implantable Lead Model: 5076
Implantable Pulse Generator Implant Date: 20240618
Lead Channel Impedance Value: 266 Ohm
Lead Channel Impedance Value: 323 Ohm
Lead Channel Impedance Value: 361 Ohm
Lead Channel Impedance Value: 760 Ohm
Lead Channel Pacing Threshold Amplitude: 0.5 V
Lead Channel Pacing Threshold Amplitude: 0.875 V
Lead Channel Pacing Threshold Pulse Width: 0.4 ms
Lead Channel Pacing Threshold Pulse Width: 0.4 ms
Lead Channel Sensing Intrinsic Amplitude: 17.5 mV
Lead Channel Sensing Intrinsic Amplitude: 17.5 mV
Lead Channel Sensing Intrinsic Amplitude: 2.875 mV
Lead Channel Sensing Intrinsic Amplitude: 2.875 mV
Lead Channel Setting Pacing Amplitude: 1.5 V
Lead Channel Setting Pacing Amplitude: 2 V
Lead Channel Setting Pacing Pulse Width: 0.4 ms
Lead Channel Setting Sensing Sensitivity: 1.2 mV
Zone Setting Status: 755011

## 2023-02-26 ENCOUNTER — Ambulatory Visit: Payer: 59 | Attending: Cardiology | Admitting: Cardiology

## 2023-02-26 ENCOUNTER — Encounter: Payer: Self-pay | Admitting: Cardiology

## 2023-02-26 VITALS — BP 160/92 | HR 89 | Ht 61.0 in | Wt 172.0 lb

## 2023-02-26 DIAGNOSIS — I442 Atrioventricular block, complete: Secondary | ICD-10-CM | POA: Diagnosis not present

## 2023-02-26 NOTE — Progress Notes (Signed)
  Electrophysiology Office Note:   Date:  02/26/2023  ID:  Becky Townsend, DOB 05/02/1971, MRN [id number]  Primary Cardiologist: None Electrophysiologist: Becky Fournet Jorja Loa, MD      History of Present Illness:   Becky Townsend is a 52 y.o. female with h/o syncope, diabetes, hypertension, hyperlipidemia seen today for routine electrophysiology followup.   She wore a cardiac monitor 10/14/2022.  Monitor had 5 pauses during which were associated with syncope.  1 pause lasted 16.5 seconds.  She is now post Abbott pacemaker.  Since last being seen in our clinic the patient reports doing well.  She has had no further episodes of syncope or near syncope.  She is happy with her control.  she denies chest pain, palpitations, dyspnea, PND, orthopnea, nausea, vomiting, dizziness, syncope, edema, weight gain, or early satiety.   Review of systems complete and found to be negative unless listed in HPI.      EP Information / Studies Reviewed:    EKG is ordered today. Personal review as below.      PPM Interrogation-  reviewed in detail today,  See PACEART report.  Device History: Medtronic Dual Chamber PPM implanted 11/10/22 for  syncope and intermittent heart block  Risk Assessment/Calculations:             Physical Exam:   VS:  There were no vitals taken for this visit.   Wt Readings from Last 3 Encounters:  01/07/23 168 lb 3.2 oz (76.3 kg)  11/17/22 168 lb (76.2 kg)  11/12/22 162 lb 4.8 oz (73.6 kg)     GEN: Well nourished, well developed in no acute distress NECK: No JVD; No carotid bruits CARDIAC: Regular rate and rhythm, no murmurs, rubs, gallops RESPIRATORY:  Clear to auscultation without rales, wheezing or rhonchi  ABDOMEN: Soft, non-tender, non-distended EXTREMITIES:  No edema; No deformity   ASSESSMENT AND PLAN:    Syncope with intermittent complete heart block  s/p Abbott PPM  Normal PPM function See Pace Art report No changes today  2.  Hyperlipidemia: Continue  Crestor per primary physician  3.  Elevated blood pressure: Blood pressure is elevated currently, but controlled at home.  No changes.  Disposition:   Follow up with EP APP in 12 months  Signed, Becky Townsend Jorja Loa, MD

## 2023-03-04 NOTE — Progress Notes (Signed)
Remote pacemaker transmission.   

## 2023-04-10 ENCOUNTER — Other Ambulatory Visit: Payer: Self-pay | Admitting: Family

## 2023-05-10 ENCOUNTER — Ambulatory Visit: Payer: 59 | Admitting: Family

## 2023-05-18 ENCOUNTER — Ambulatory Visit (INDEPENDENT_AMBULATORY_CARE_PROVIDER_SITE_OTHER): Payer: 59

## 2023-05-18 DIAGNOSIS — I442 Atrioventricular block, complete: Secondary | ICD-10-CM | POA: Diagnosis not present

## 2023-05-18 LAB — CUP PACEART REMOTE DEVICE CHECK
Battery Remaining Longevity: 175 mo
Battery Voltage: 3.14 V
Brady Statistic AP VP Percent: 0 %
Brady Statistic AP VS Percent: 0 %
Brady Statistic AS VP Percent: 0.04 %
Brady Statistic AS VS Percent: 99.95 %
Brady Statistic RA Percent Paced: 0.01 %
Brady Statistic RV Percent Paced: 0.04 %
Date Time Interrogation Session: 20241223225507
Implantable Lead Connection Status: 753985
Implantable Lead Connection Status: 753985
Implantable Lead Implant Date: 20240618
Implantable Lead Implant Date: 20240619
Implantable Lead Location: 753859
Implantable Lead Location: 753860
Implantable Lead Model: 3830
Implantable Lead Model: 5076
Implantable Pulse Generator Implant Date: 20240618
Lead Channel Impedance Value: 342 Ohm
Lead Channel Impedance Value: 361 Ohm
Lead Channel Impedance Value: 551 Ohm
Lead Channel Impedance Value: 570 Ohm
Lead Channel Pacing Threshold Amplitude: 0.5 V
Lead Channel Pacing Threshold Amplitude: 0.875 V
Lead Channel Pacing Threshold Pulse Width: 0.4 ms
Lead Channel Pacing Threshold Pulse Width: 0.4 ms
Lead Channel Sensing Intrinsic Amplitude: 18.875 mV
Lead Channel Sensing Intrinsic Amplitude: 18.875 mV
Lead Channel Sensing Intrinsic Amplitude: 3.375 mV
Lead Channel Sensing Intrinsic Amplitude: 3.375 mV
Lead Channel Setting Pacing Amplitude: 1.5 V
Lead Channel Setting Pacing Amplitude: 2 V
Lead Channel Setting Pacing Pulse Width: 0.4 ms
Lead Channel Setting Sensing Sensitivity: 1.2 mV
Zone Setting Status: 755011

## 2023-06-04 ENCOUNTER — Ambulatory Visit: Payer: 59 | Admitting: Family

## 2023-06-04 ENCOUNTER — Ambulatory Visit (INDEPENDENT_AMBULATORY_CARE_PROVIDER_SITE_OTHER): Payer: 59 | Admitting: Family

## 2023-06-04 VITALS — BP 127/78 | HR 87 | Temp 98.0°F | Ht 61.0 in | Wt 174.0 lb

## 2023-06-04 DIAGNOSIS — E785 Hyperlipidemia, unspecified: Secondary | ICD-10-CM

## 2023-06-04 DIAGNOSIS — K5900 Constipation, unspecified: Secondary | ICD-10-CM

## 2023-06-04 DIAGNOSIS — Z23 Encounter for immunization: Secondary | ICD-10-CM | POA: Diagnosis not present

## 2023-06-04 DIAGNOSIS — M14679 Charcot's joint, unspecified ankle and foot: Secondary | ICD-10-CM

## 2023-06-04 DIAGNOSIS — E1169 Type 2 diabetes mellitus with other specified complication: Secondary | ICD-10-CM

## 2023-06-04 DIAGNOSIS — I10 Essential (primary) hypertension: Secondary | ICD-10-CM

## 2023-06-04 DIAGNOSIS — I951 Orthostatic hypotension: Secondary | ICD-10-CM

## 2023-06-04 DIAGNOSIS — I459 Conduction disorder, unspecified: Secondary | ICD-10-CM

## 2023-06-04 LAB — BAYER DCA HB A1C WAIVED: HB A1C (BAYER DCA - WAIVED): 5.3 % (ref 4.8–5.6)

## 2023-06-04 MED ORDER — ROSUVASTATIN CALCIUM 5 MG PO TABS: 5.0000 mg | ORAL_TABLET | Freq: Every day | ORAL | 2 refills | Status: DC

## 2023-06-04 NOTE — Progress Notes (Signed)
 Subjective:    Patient ID: Becky Townsend, female    DOB: [birth year], 53 y.o.   MRN: [id number]  Chief Complaint  Patient presents with   med check    No concerns at this time.    Pt presents to the office today for chronic follow up. She has been diagnosed charcot of right foot. She has seen Ortho and is in a boot.   She is followed by Cardiologists every 6 months for heart block and has pacemaker in place.     She is a diabetic, but since finding out she has changed her diet. She has lost 53 lbs since Feb 2023. She has gained 5 lbs since our last visit.      06/04/2023   12:07 PM 02/26/2023    2:59 PM 01/07/2023    3:16 PM  Last 3 Weights  Weight (lbs) 174 lb 172 lb 168 lb 3.2 oz  Weight (kg) 78.926 kg 78.019 kg 76.295 kg     Hypertension This is a chronic problem. The current episode started more than 1 year ago. The problem has been resolved since onset. The problem is controlled. Associated symptoms include blurred vision and peripheral edema (right ankle). Pertinent negatives include no malaise/fatigue or shortness of breath. Risk factors for coronary artery disease include dyslipidemia, diabetes mellitus, obesity and sedentary lifestyle. The current treatment provides moderate improvement.  Hyperlipidemia This is a chronic problem. The current episode started more than 1 year ago. The problem is uncontrolled. Recent lipid tests were reviewed and are high. Exacerbating diseases include obesity. Pertinent negatives include no shortness of breath. Current antihyperlipidemic treatment includes statins. The current treatment provides moderate improvement of lipids. Risk factors for coronary artery disease include diabetes mellitus, dyslipidemia, hypertension and a sedentary lifestyle.  Diabetes She presents for her follow-up diabetic visit. She has type 2 diabetes mellitus. Associated symptoms include blurred vision and foot paresthesias. Symptoms are stable. Diabetic complications  include heart disease and peripheral neuropathy. Risk factors for coronary artery disease include diabetes mellitus, dyslipidemia, hypertension, sedentary lifestyle and post-menopausal. She is following a generally healthy and diabetic diet. Her overall blood glucose range is 110-130 mg/dl. Eye exam is current.  Constipation This is a chronic problem. The current episode started more than 1 year ago. The problem has been resolved since onset. Her stool frequency is 1 time per day. The treatment provided moderate relief.      Review of Systems  Constitutional:  Negative for malaise/fatigue.  Eyes:  Positive for blurred vision.  Respiratory:  Negative for shortness of breath.   Gastrointestinal:  Positive for constipation.  All other systems reviewed and are negative.  Family History  Problem Relation Age of Onset   Hypertension Father    Breast cancer Neg Hx    Social History   Socioeconomic History   Marital status: Married    Spouse name: Not on file   Number of children: Not on file   Years of education: Not on file   Highest education level: 12th grade  Occupational History   Not on file  Tobacco Use   Smoking status: Never   Smokeless tobacco: Never  Vaping Use   Vaping status: Never Used  Substance and Sexual Activity   Alcohol use: Not Currently    Comment: social only not often at all   Drug use: Never   Sexual activity: Yes    Birth control/protection: None  Other Topics Concern   Not on file  Social History  Narrative   Right handed   Lives at home with husband    Caffeine- none   Social Drivers of Health   Financial Resource Strain: Low Risk  (06/04/2023)   Overall Financial Resource Strain (CARDIA)    Difficulty of Paying Living Expenses: Not very hard  Food Insecurity: No Food Insecurity (06/04/2023)   Hunger Vital Sign    Worried About Running Out of Food in the Last Year: Never true    Ran Out of Food in the Last Year: Never true  Transportation  Needs: No Transportation Needs (06/04/2023)   PRAPARE - Administrator, Civil Service (Medical): No    Lack of Transportation (Non-Medical): No  Physical Activity: Insufficiently Active (06/04/2023)   Exercise Vital Sign    Days of Exercise per Week: 5 days    Minutes of Exercise per Session: 20 min  Stress: No Stress Concern Present (06/04/2023)   [employer] of Occupational Health - Occupational Stress Questionnaire    Feeling of Stress : Only a little  Social Connections: Socially Integrated (06/04/2023)   Social Connection and Isolation Panel [NHANES]    Frequency of Communication with Friends and Family: More than three times a week    Frequency of Social Gatherings with Friends and Family: More than three times a week    Attends Religious Services: More than 4 times per year    Active Member of [employer] or Organizations: Yes    Attends Banker Meetings: 1 to 4 times per year    Marital Status: Married  Catering Manager Violence: Not At Risk (10/05/2022)   Humiliation, Afraid, Rape, and Kick questionnaire    Fear of Current or Ex-Partner: No    Emotionally Abused: No    Physically Abused: No    Sexually Abused: No       Objective:   Physical Exam Vitals reviewed.  Constitutional:      General: She is not in acute distress.    Appearance: She is well-developed. She is obese.  HENT:     Head: Normocephalic and atraumatic.     Right Ear: Tympanic membrane normal.     Left Ear: Tympanic membrane normal.  Eyes:     Pupils: Pupils are equal, round, and reactive to light.  Neck:     Thyroid : No thyromegaly.  Cardiovascular:     Rate and Rhythm: Normal rate and regular rhythm.     Heart sounds: Normal heart sounds. No murmur heard. Pulmonary:     Effort: Pulmonary effort is normal. No respiratory distress.     Breath sounds: Normal breath sounds. No wheezing.  Abdominal:     General: Bowel sounds are normal. There is no distension.     Palpations:  Abdomen is soft.     Tenderness: There is no abdominal tenderness.  Musculoskeletal:        General: Tenderness and deformity (left boot on foot) present. Normal range of motion.     Cervical back: Normal range of motion and neck supple.     Comments: Boot on right foot  Skin:    General: Skin is warm and dry.  Neurological:     Mental Status: She is alert and oriented to person, place, and time.     Cranial Nerves: No cranial nerve deficit.     Deep Tendon Reflexes: Reflexes are normal and symmetric.  Psychiatric:        Behavior: Behavior normal.        Thought Content: Thought  content normal.        Judgment: Judgment normal.      BP 127/78   Pulse 87   Temp 98 F (36.7 C) (Temporal)   Ht 5' 1 (1.549 m)   Wt 174 lb (78.9 kg)   SpO2 98%   BMI 32.88 kg/m      Assessment & Plan:  Becky Townsend comes in today with chief complaint of med check (No concerns at this time. )   Diagnosis and orders addressed: 1. Type 2 diabetes mellitus with other specified complication, without long-term current use of insulin  (HCC) (Primary) - Bayer DCA Hb A1c Waived - CMP14+EGFR - Microalbumin / creatinine urine ratio  2. Hyperlipidemia associated with type 2 diabetes mellitus (HCC) - CMP14+EGFR - rosuvastatin  (CRESTOR ) 5 MG tablet; Take 1 tablet (5 mg total) by mouth daily.  Dispense: 90 tablet; Refill: 2  3. Charcot arthropathy of midfoot - CMP14+EGFR  4. Primary hypertension - CMP14+EGFR  5. Constipation, unspecified constipation type - CMP14+EGFR  6. Heart block - CMP14+EGFR  7. Orthostatic hypotension - CMP14+EGFR  8. Encounter for immunization  - Flu vaccine trivalent PF, 6mos and older(Flulaval,Afluria,Fluarix,Fluzone)  Labs pending Continue current medications  Health Maintenance reviewed Diet and exercise encouraged  Follow up plan: 4 months    Bari Learn, FNP

## 2023-06-04 NOTE — Patient Instructions (Signed)

## 2023-06-05 LAB — CMP14+EGFR
ALT: 24 [IU]/L (ref 0–32)
AST: 21 [IU]/L (ref 0–40)
Albumin: 4.6 g/dL (ref 3.8–4.9)
Alkaline Phosphatase: 76 [IU]/L (ref 44–121)
BUN/Creatinine Ratio: 49 — ABNORMAL HIGH (ref 9–23)
BUN: 39 mg/dL — ABNORMAL HIGH (ref 6–24)
Bilirubin Total: 0.4 mg/dL (ref 0.0–1.2)
CO2: 22 mmol/L (ref 20–29)
Calcium: 9.5 mg/dL (ref 8.7–10.2)
Chloride: 107 mmol/L — ABNORMAL HIGH (ref 96–106)
Creatinine, Ser: 0.8 mg/dL (ref 0.57–1.00)
Globulin, Total: 2.2 g/dL (ref 1.5–4.5)
Glucose: 109 mg/dL — ABNORMAL HIGH (ref 70–99)
Potassium: 4.4 mmol/L (ref 3.5–5.2)
Sodium: 143 mmol/L (ref 134–144)
Total Protein: 6.8 g/dL (ref 6.0–8.5)
eGFR: 88 mL/min/{1.73_m2} (ref >59)

## 2023-06-06 LAB — MICROALBUMIN / CREATININE URINE RATIO
Creatinine, Urine: 47.1 mg/dL
Microalb/Creat Ratio: 34 mg/g{creat} — ABNORMAL HIGH (ref 0–29)
Microalbumin, Urine: 15.8 ug/mL

## 2023-06-28 NOTE — Progress Notes (Signed)
 Remote pacemaker transmission.

## 2023-08-17 ENCOUNTER — Ambulatory Visit (INDEPENDENT_AMBULATORY_CARE_PROVIDER_SITE_OTHER): Payer: 59

## 2023-08-17 DIAGNOSIS — I442 Atrioventricular block, complete: Secondary | ICD-10-CM

## 2023-08-17 LAB — CUP PACEART REMOTE DEVICE CHECK
Battery Remaining Longevity: 172 mo
Battery Voltage: 3.1 V
Brady Statistic AP VP Percent: 0 %
Brady Statistic AP VS Percent: 0 %
Brady Statistic AS VP Percent: 0.04 %
Brady Statistic AS VS Percent: 99.95 %
Brady Statistic RA Percent Paced: 0.01 %
Brady Statistic RV Percent Paced: 0.05 %
Date Time Interrogation Session: 20250324194036
Implantable Lead Connection Status: 753985
Implantable Lead Connection Status: 753985
Implantable Lead Implant Date: 20240618
Implantable Lead Implant Date: 20240619
Implantable Lead Location: 753859
Implantable Lead Location: 753860
Implantable Lead Model: 3830
Implantable Lead Model: 5076
Implantable Pulse Generator Implant Date: 20240618
Lead Channel Impedance Value: 323 Ohm
Lead Channel Impedance Value: 361 Ohm
Lead Channel Impedance Value: 551 Ohm
Lead Channel Impedance Value: 551 Ohm
Lead Channel Pacing Threshold Amplitude: 0.625 V
Lead Channel Pacing Threshold Amplitude: 0.875 V
Lead Channel Pacing Threshold Pulse Width: 0.4 ms
Lead Channel Pacing Threshold Pulse Width: 0.4 ms
Lead Channel Sensing Intrinsic Amplitude: 18.25 mV
Lead Channel Sensing Intrinsic Amplitude: 18.25 mV
Lead Channel Sensing Intrinsic Amplitude: 2.75 mV
Lead Channel Sensing Intrinsic Amplitude: 2.75 mV
Lead Channel Setting Pacing Amplitude: 1.5 V
Lead Channel Setting Pacing Amplitude: 2 V
Lead Channel Setting Pacing Pulse Width: 0.4 ms
Lead Channel Setting Sensing Sensitivity: 1.2 mV
Zone Setting Status: 755011

## 2023-09-17 LAB — HM DIABETES EYE EXAM

## 2023-09-25 ENCOUNTER — Other Ambulatory Visit: Payer: Self-pay | Admitting: Family

## 2023-10-01 NOTE — Progress Notes (Signed)
 Remote pacemaker transmission.

## 2023-10-04 ENCOUNTER — Ambulatory Visit: Payer: 59 | Admitting: Family

## 2023-10-19 ENCOUNTER — Ambulatory Visit (INDEPENDENT_AMBULATORY_CARE_PROVIDER_SITE_OTHER): Admitting: Family

## 2023-10-19 ENCOUNTER — Encounter: Payer: Self-pay | Admitting: Family

## 2023-10-19 VITALS — BP 129/84 | HR 86 | Temp 97.5°F | Ht 61.0 in | Wt 178.0 lb

## 2023-10-19 DIAGNOSIS — Z0001 Encounter for general adult medical examination with abnormal findings: Secondary | ICD-10-CM | POA: Diagnosis not present

## 2023-10-19 DIAGNOSIS — I10 Essential (primary) hypertension: Secondary | ICD-10-CM

## 2023-10-19 DIAGNOSIS — M14679 Charcot's joint, unspecified ankle and foot: Secondary | ICD-10-CM

## 2023-10-19 DIAGNOSIS — I459 Conduction disorder, unspecified: Secondary | ICD-10-CM

## 2023-10-19 DIAGNOSIS — E1169 Type 2 diabetes mellitus with other specified complication: Secondary | ICD-10-CM

## 2023-10-19 DIAGNOSIS — Z0000 Encounter for general adult medical examination without abnormal findings: Secondary | ICD-10-CM

## 2023-10-19 DIAGNOSIS — E785 Hyperlipidemia, unspecified: Secondary | ICD-10-CM

## 2023-10-19 LAB — LIPID PANEL

## 2023-10-19 LAB — BAYER DCA HB A1C WAIVED: HB A1C (BAYER DCA - WAIVED): 4.9 % (ref 4.8–5.6)

## 2023-10-19 NOTE — Patient Instructions (Signed)

## 2023-10-19 NOTE — Progress Notes (Signed)
 Subjective:    Patient ID: Becky Townsend, female    DOB: 1970/11/23, 53 y.o.   MRN: [id number]  Chief Complaint  Patient presents with   Medical Management of Chronic Issues    Paper work for work.left thumb popping out of joint and tender. X 1 week    Pt presents to the office today for CPE and chronic follow up. She has been diagnosed charcot of right foot. She has seen Ortho and is in a boot.   She is followed by Cardiologists annually for heart block and has pacemaker in place.     She is a diabetic, but since finding out she has changed her diet. She has lost 53 lbs since Feb 2023. She has gained 5 lbs since our last visit.      10/19/2023    3:00 PM 06/04/2023   12:07 PM 02/26/2023    2:59 PM  Last 3 Weights  Weight (lbs) 178 lb 174 lb 172 lb  Weight (kg) 80.74 kg 78.926 kg 78.019 kg     Hypertension This is a chronic problem. The current episode started more than 1 year ago. The problem has been resolved since onset. The problem is controlled. Associated symptoms include blurred vision and peripheral edema (right ankle). Pertinent negatives include no malaise/fatigue or shortness of breath. Risk factors for coronary artery disease include dyslipidemia, diabetes mellitus, obesity and sedentary lifestyle. The current treatment provides moderate improvement. Hypertensive end-organ damage includes retinopathy.  Hyperlipidemia This is a chronic problem. The current episode started more than 1 year ago. The problem is controlled. Recent lipid tests were reviewed and are normal. Exacerbating diseases include obesity. Pertinent negatives include no shortness of breath. Current antihyperlipidemic treatment includes statins. The current treatment provides moderate improvement of lipids. Risk factors for coronary artery disease include diabetes mellitus, dyslipidemia, hypertension and a sedentary lifestyle.  Diabetes She presents for her follow-up diabetic visit. She has type 2 diabetes  mellitus. Associated symptoms include blurred vision and foot paresthesias. Symptoms are stable. Diabetic complications include heart disease, peripheral neuropathy and retinopathy. Risk factors for coronary artery disease include diabetes mellitus, dyslipidemia, hypertension, sedentary lifestyle and post-menopausal. She is following a generally healthy and diabetic diet. Her overall blood glucose range is 110-130 mg/dl. Eye exam is current.      Review of Systems  Constitutional:  Negative for malaise/fatigue.  Eyes:  Positive for blurred vision.  Respiratory:  Negative for shortness of breath.   All other systems reviewed and are negative.  Family History  Problem Relation Age of Onset   Hypertension Father    Breast cancer Neg Hx    Social History   Socioeconomic History   Marital status: Married    Spouse name: Not on file   Number of children: Not on file   Years of education: Not on file   Highest education level: 12th grade  Occupational History   Not on file  Tobacco Use   Smoking status: Never   Smokeless tobacco: Never  Vaping Use   Vaping status: Never Used  Substance and Sexual Activity   Alcohol use: Not Currently    Comment: social only not often at all   Drug use: Never   Sexual activity: Yes    Birth control/protection: None  Other Topics Concern   Not on file  Social History Narrative   Right handed   Lives at home with husband    Caffeine- none   Social Drivers of Dispensing optician  Resource Strain: Low Risk  (06/04/2023)   Overall Financial Resource Strain (CARDIA)    Difficulty of Paying Living Expenses: Not very hard  Food Insecurity: No Food Insecurity (06/04/2023)   Hunger Vital Sign    Worried About Running Out of Food in the Last Year: Never true    Ran Out of Food in the Last Year: Never true  Transportation Needs: No Transportation Needs (06/04/2023)   PRAPARE - Administrator, Civil Service (Medical): No    Lack of  Transportation (Non-Medical): No  Physical Activity: Insufficiently Active (06/04/2023)   Exercise Vital Sign    Days of Exercise per Week: 5 days    Minutes of Exercise per Session: 20 min  Stress: No Stress Concern Present (06/04/2023)   [employer] of Occupational Health - Occupational Stress Questionnaire    Feeling of Stress : Only a little  Social Connections: Socially Integrated (06/04/2023)   Social Connection and Isolation Panel [NHANES]    Frequency of Communication with Friends and Family: More than three times a week    Frequency of Social Gatherings with Friends and Family: More than three times a week    Attends Religious Services: More than 4 times per year    Active Member of [employer] or Organizations: Yes    Attends Banker Meetings: 1 to 4 times per year    Marital Status: Married  Catering manager Violence: Not At Risk (10/05/2022)   Humiliation, Afraid, Rape, and Kick questionnaire    Fear of Current or Ex-Partner: No    Emotionally Abused: No    Physically Abused: No    Sexually Abused: No       Objective:   Physical Exam Vitals reviewed.  Constitutional:      General: She is not in acute distress.    Appearance: She is well-developed. She is obese.  HENT:     Head: Normocephalic and atraumatic.     Right Ear: Tympanic membrane normal.     Left Ear: Tympanic membrane normal.  Eyes:     Pupils: Pupils are equal, round, and reactive to light.  Neck:     Thyroid : No thyromegaly.  Cardiovascular:     Rate and Rhythm: Normal rate and regular rhythm.     Heart sounds: Normal heart sounds. No murmur heard. Pulmonary:     Effort: Pulmonary effort is normal. No respiratory distress.     Breath sounds: Normal breath sounds. No wheezing.  Abdominal:     General: Bowel sounds are normal. There is no distension.     Palpations: Abdomen is soft.     Tenderness: There is no abdominal tenderness.  Musculoskeletal:        General: Tenderness and  deformity (left boot on foot) present. Normal range of motion.     Cervical back: Normal range of motion and neck supple.     Comments: Boot on right foot  Skin:    General: Skin is warm and dry.  Neurological:     Mental Status: She is alert and oriented to person, place, and time.     Cranial Nerves: No cranial nerve deficit.     Deep Tendon Reflexes: Reflexes are normal and symmetric.  Psychiatric:        Behavior: Behavior normal.        Thought Content: Thought content normal.        Judgment: Judgment normal.      BP 129/84   Pulse 86   Temp Aaron Aas)  97.5 F (36.4 C)   Ht 5\' 1"  (1.549 m)   Wt 178 lb (80.7 kg)   SpO2 96%   BMI 33.63 kg/m      Assessment & Plan:  Becky Townsend comes in today with chief complaint of Medical Management of Chronic Issues (Paper work for work.left thumb popping out of joint and tender. X 1 week )   Diagnosis and orders addressed: 1. Annual physical exam (Primary) - Bayer DCA Hb A1c Waived - CMP14+EGFR - CBC with Differential/Platelet - Lipid panel - Vitamin B12 - TSH  2. Type 2 diabetes mellitus with other specified complication, without long-term current use of insulin  (HCC) - Bayer DCA Hb A1c Waived - CMP14+EGFR - CBC with Differential/Platelet - Vitamin B12 - TSH  3. Hyperlipidemia associated with type 2 diabetes mellitus (HCC) - CMP14+EGFR - CBC with Differential/Platelet - Lipid panel  4. Charcot arthropathy of midfoot - CMP14+EGFR - CBC with Differential/Platelet  5. Primary hypertension - CMP14+EGFR - CBC with Differential/Platelet  6. Heart block - CMP14+EGFR - CBC with Differential/Platelet  Labs pending Continue current medications  Health Maintenance reviewed Diet and exercise encouraged  Follow up plan: 4 months    Tommas Fragmin, FNP

## 2023-10-20 LAB — CMP14+EGFR
ALT: 39 IU/L — ABNORMAL HIGH (ref 0–32)
AST: 31 IU/L (ref 0–40)
Albumin: 4.6 g/dL (ref 3.8–4.9)
Alkaline Phosphatase: 81 IU/L (ref 44–121)
BUN/Creatinine Ratio: 43 — ABNORMAL HIGH (ref 9–23)
BUN: 34 mg/dL — ABNORMAL HIGH (ref 6–24)
Bilirubin Total: 0.5 mg/dL (ref 0.0–1.2)
CO2: 20 mmol/L (ref 20–29)
Calcium: 9.6 mg/dL (ref 8.7–10.2)
Chloride: 104 mmol/L (ref 96–106)
Creatinine, Ser: 0.79 mg/dL (ref 0.57–1.00)
Globulin, Total: 2.3 g/dL (ref 1.5–4.5)
Glucose: 106 mg/dL — ABNORMAL HIGH (ref 70–99)
Potassium: 4.8 mmol/L (ref 3.5–5.2)
Sodium: 140 mmol/L (ref 134–144)
Total Protein: 6.9 g/dL (ref 6.0–8.5)
eGFR: 89 mL/min/{1.73_m2} (ref >59)

## 2023-10-20 LAB — LIPID PANEL
Cholesterol, Total: 192 mg/dL (ref 100–199)
HDL: 70 mg/dL (ref >39)
LDL CALC COMMENT:: 2.7 ratio (ref 0.0–4.4)
LDL Chol Calc (NIH): 110 mg/dL — ABNORMAL HIGH (ref 0–99)
Triglycerides: 62 mg/dL (ref 0–149)
VLDL Cholesterol Cal: 12 mg/dL (ref 5–40)

## 2023-10-20 LAB — CBC WITH DIFFERENTIAL/PLATELET
Basophils Absolute: 0.1 10*3/uL (ref 0.0–0.2)
Basos: 1 %
EOS (ABSOLUTE): 0.3 10*3/uL (ref 0.0–0.4)
Eos: 5 %
Hematocrit: 40 % (ref 34.0–46.6)
Hemoglobin: 13.3 g/dL (ref 11.1–15.9)
Immature Grans (Abs): 0 10*3/uL (ref 0.0–0.1)
Immature Granulocytes: 0 %
Lymphocytes Absolute: 1.6 10*3/uL (ref 0.7–3.1)
Lymphs: 24 %
MCH: 30.7 pg (ref 26.6–33.0)
MCHC: 33.3 g/dL (ref 31.5–35.7)
MCV: 92 fL (ref 79–97)
Monocytes Absolute: 0.4 10*3/uL (ref 0.1–0.9)
Monocytes: 6 %
Neutrophils Absolute: 4.3 10*3/uL (ref 1.4–7.0)
Neutrophils: 64 %
Platelets: 176 10*3/uL (ref 150–450)
RBC: 4.33 x10E6/uL (ref 3.77–5.28)
RDW: 12.9 % (ref 11.7–15.4)
WBC: 6.7 10*3/uL (ref 3.4–10.8)

## 2023-10-20 LAB — VITAMIN B12: Vitamin B-12: 829 pg/mL (ref 232–1245)

## 2023-10-20 LAB — TSH: TSH: 1.21 u[IU]/mL (ref 0.450–4.500)

## 2023-10-21 ENCOUNTER — Ambulatory Visit: Payer: Self-pay | Admitting: Family

## 2023-11-09 ENCOUNTER — Other Ambulatory Visit: Payer: Self-pay | Admitting: Orthopedic Surgery

## 2023-11-09 DIAGNOSIS — M14671 Charcot's joint, right ankle and foot: Secondary | ICD-10-CM

## 2023-11-15 ENCOUNTER — Ambulatory Visit
Admission: RE | Admit: 2023-11-15 | Discharge: 2023-11-15 | Discharge status: Home / Self Care | Source: Ambulatory Visit | Attending: Orthopedic Surgery | Admitting: Orthopedic Surgery

## 2023-11-15 ENCOUNTER — Ambulatory Visit
Admission: RE | Admit: 2023-11-15 | Discharge: 2023-11-15 | Disposition: A | Discharge status: Home / Self Care | Source: Ambulatory Visit | Attending: Orthopedic Surgery | Admitting: Orthopedic Surgery

## 2023-11-15 DIAGNOSIS — M14671 Charcot's joint, right ankle and foot: Secondary | ICD-10-CM

## 2023-11-16 ENCOUNTER — Other Ambulatory Visit: Payer: Self-pay | Admitting: Orthopedic Surgery

## 2023-11-16 ENCOUNTER — Ambulatory Visit (INDEPENDENT_AMBULATORY_CARE_PROVIDER_SITE_OTHER): Payer: 59

## 2023-11-16 DIAGNOSIS — M14671 Charcot's joint, right ankle and foot: Secondary | ICD-10-CM

## 2023-11-16 DIAGNOSIS — I442 Atrioventricular block, complete: Secondary | ICD-10-CM

## 2023-11-17 LAB — CUP PACEART REMOTE DEVICE CHECK
Battery Remaining Longevity: 169 mo
Battery Voltage: 3.07 V
Brady Statistic AP VP Percent: 0.01 %
Brady Statistic AP VS Percent: 0 %
Brady Statistic AS VP Percent: 0.06 %
Brady Statistic AS VS Percent: 99.93 %
Brady Statistic RA Percent Paced: 0.01 %
Brady Statistic RV Percent Paced: 0.07 %
Date Time Interrogation Session: 20250623182950
Implantable Lead Connection Status: 753985
Implantable Lead Connection Status: 753985
Implantable Lead Implant Date: 20240618
Implantable Lead Implant Date: 20240619
Implantable Lead Location: 753859
Implantable Lead Location: 753860
Implantable Lead Model: 3830
Implantable Lead Model: 5076
Implantable Pulse Generator Implant Date: 20240618
Lead Channel Impedance Value: 323 Ohm
Lead Channel Impedance Value: 380 Ohm
Lead Channel Impedance Value: 513 Ohm
Lead Channel Impedance Value: 608 Ohm
Lead Channel Pacing Threshold Amplitude: 0.5 V
Lead Channel Pacing Threshold Amplitude: 1 V
Lead Channel Pacing Threshold Pulse Width: 0.4 ms
Lead Channel Pacing Threshold Pulse Width: 0.4 ms
Lead Channel Sensing Intrinsic Amplitude: 17 mV
Lead Channel Sensing Intrinsic Amplitude: 17 mV
Lead Channel Sensing Intrinsic Amplitude: 2.625 mV
Lead Channel Sensing Intrinsic Amplitude: 2.625 mV
Lead Channel Setting Pacing Amplitude: 1.5 V
Lead Channel Setting Pacing Amplitude: 2 V
Lead Channel Setting Pacing Pulse Width: 0.4 ms
Lead Channel Setting Sensing Sensitivity: 1.2 mV
Zone Setting Status: 755011

## 2023-11-21 ENCOUNTER — Ambulatory Visit: Payer: Self-pay | Admitting: Cardiology

## 2023-11-24 ENCOUNTER — Telehealth: Payer: Self-pay

## 2023-11-24 ENCOUNTER — Encounter: Payer: Self-pay | Admitting: Cardiology

## 2023-11-24 NOTE — Telephone Encounter (Signed)
 Please make recommendations for peri-operative PPM management and route recommendations to [phone]  Thank you!

## 2023-11-24 NOTE — Telephone Encounter (Signed)
   Pre-operative Risk Assessment    Patient Name: [name]  DOB: 04/02/1971 MRN: [id number]   Date of last office visit: 02/26/23 WILL CAMNITZ, MD Date of next office visit: NONE   Request for Surgical Clearance    Procedure:  RIGHT TALONAVICULAR AND SUTALAR JOINT ARTHRODESES; MIDFOOT OSTEOTOMY; HEELCORD LENGTHENING  Date of Surgery:  Clearance TBD                                Surgeon:  DR NORLEEN HEWITT Surgeon's Group or Practice Name:  [facility] Phone number:  [phone] Fax number:  [phone]   Type of Clearance Requested:   - Medical    Type of Anesthesia:  General    Additional requests/questions:    SignedLucie DELENA Townsend   11/24/2023, 3:12 PM

## 2023-11-24 NOTE — Progress Notes (Signed)
 PERIOPERATIVE PRESCRIPTION FOR IMPLANTED CARDIAC DEVICE PROGRAMMING   Patient Information:  Patient: Becky Townsend  MRN: [id number]  Date of Birth: [birth year]   Procedure:  RIGHT TALONAVICULAR AND SUTALAR JOINT ARTHRODESES; MIDFOOT OSTEOTOMY; HEELCORD LENGTHENING   Date of Surgery:  Clearance TBD                                  Surgeon:  DR NORLEEN HEWITT Surgeon's Group or Practice Name:  [facility] Phone number:  [phone] Fax number:  [phone]    Device Information:   Clinic EP Physician:   Dr. Ole Holts Device Type:  Pacemaker Manufacturer and Phone #:  Medtronic: [id number] Pacemaker Dependent?:  No Date of Last Device Check:  11/15/2023         Normal Device Function?:  Yes     Electrophysiologist's Recommendations:   Have magnet available. Provide continuous ECG monitoring when magnet is used or reprogramming is to be performed.  Procedure should not interfere with device function.  No device programming or magnet placement needed.  Per Device Clinic Standing Orders, Almarie ONEIDA Shutter  11/24/2023 10:44 PM

## 2023-11-24 NOTE — Telephone Encounter (Signed)
   Name: Becky Townsend  DOB: 1971/01/05  MRN: [id number]  Primary Cardiologist: Inocencio   Preoperative team, please contact this patient and set up a phone call appointment for further preoperative risk assessment. Please obtain consent and complete medication review. Thank you for your help.  I confirm that guidance regarding antiplatelet and oral anticoagulation therapy has been completed and, if necessary, noted below.  Has PPM. Will route to Device clinic for recommendations.  I also confirmed the patient resides in the state of [place] . As per [facility] Medical Board telemedicine laws, the patient must reside in the state in which the provider is licensed.   Lamarr Satterfield, NP 11/24/2023, 3:35 PM [facility]

## 2023-12-13 ENCOUNTER — Telehealth: Payer: Self-pay

## 2023-12-13 NOTE — Telephone Encounter (Signed)
 S/W pt and scheduled TELE Preop appt 12/16/23. Med Rec and Consent done

## 2023-12-13 NOTE — Telephone Encounter (Signed)
 Med Rec and Consent done.     Patient Consent for Virtual Visit        Becky Townsend has provided verbal consent on 12/13/2023 for a virtual visit (video or telephone).   CONSENT FOR VIRTUAL VISIT FOR:  Becky Townsend  By participating in this virtual visit I agree to the following:  I hereby voluntarily request, consent and authorize [facility] and its employed or contracted physicians, physician assistants, nurse practitioners or other licensed health care professionals (the Practitioner), to provide me with telemedicine health care services (the "Services) as deemed necessary by the treating Practitioner. I acknowledge and consent to receive the Services by the Practitioner via telemedicine. I understand that the telemedicine visit will involve communicating with the Practitioner through live audiovisual communication technology and the disclosure of certain medical information by electronic transmission. I acknowledge that I have been given the opportunity to request an in-person assessment or other available alternative prior to the telemedicine visit and am voluntarily participating in the telemedicine visit.  I understand that I have the right to withhold or withdraw my consent to the use of telemedicine in the course of my care at any time, without affecting my right to future care or treatment, and that the Practitioner or I may terminate the telemedicine visit at any time. I understand that I have the right to inspect all information obtained and/or recorded in the course of the telemedicine visit and may receive copies of available information for a reasonable fee.  I understand that some of the potential risks of receiving the Services via telemedicine include:  Delay or interruption in medical evaluation due to technological equipment failure or disruption; Information transmitted may not be sufficient (e.g. poor resolution of images) to allow for appropriate medical  decision making by the Practitioner; and/or  In rare instances, security protocols could fail, causing a breach of personal health information.  Furthermore, I acknowledge that it is my responsibility to provide information about my medical history, conditions and care that is complete and accurate to the best of my ability. I acknowledge that Practitioner's advice, recommendations, and/or decision may be based on factors not within their control, such as incomplete or inaccurate data provided by me or distortions of diagnostic images or specimens that may result from electronic transmissions. I understand that the practice of medicine is not an exact science and that Practitioner makes no warranties or guarantees regarding treatment outcomes. I acknowledge that a copy of this consent can be made available to me via my patient portal [facility] MyChart), or I can request a printed copy by calling the office of [facility].    I understand that my insurance will be billed for this visit.   I have read or had this consent read to me. I understand the contents of this consent, which adequately explains the benefits and risks of the Services being provided via telemedicine.  I have been provided ample opportunity to ask questions regarding this consent and the Services and have had my questions answered to my satisfaction. I give my informed consent for the services to be provided through the use of telemedicine in my medical care

## 2023-12-16 ENCOUNTER — Ambulatory Visit: Attending: Cardiology | Admitting: Emergency Medicine

## 2023-12-16 DIAGNOSIS — Z01810 Encounter for preprocedural cardiovascular examination: Secondary | ICD-10-CM | POA: Diagnosis not present

## 2023-12-16 NOTE — Progress Notes (Signed)
 Virtual Visit via Telephone Note   Because of [name] co-morbid illnesses, she is at least at moderate risk for complications without adequate follow up.  This format is felt to be most appropriate for this patient at this time.  Due to technical limitations with video connection (technology), today's appointment will be conducted as an audio only telehealth visit, and [name] verbally agreed to proceed in this manner.   All issues noted in this document were discussed and addressed.  No physical exam could be performed with this format.  Evaluation Performed:  Preoperative cardiovascular risk assessment _____________   Date:  12/16/2023   Patient ID:  Becky Townsend, DOB [birth year], MRN [id number] Patient Location:  Home Provider location:   Office  Primary Care Provider:  Lavell Bari LABOR, FNP Primary Cardiologist:  None  Chief Complaint / Patient Profile   53 y.o. y/o female with a h/o syncope with intermittent complete heart block s/p PPM, diabetes, hypertension, hyperlipidemia who is pending right talonavicular and sutalar joint arthrodeses, midfoot osteotomy, heelcord lengthening on date TBD with [facility] and presents today for telephonic preoperative cardiovascular risk assessment.  History of Present Illness    Becky Townsend is a 53 y.o. female who presents via audio/video conferencing for a telehealth visit today.  Pt was last seen in cardiology clinic on 02/26/2023 by Dr. Inocencio.  At that time [name] was doing well.  The patient is now pending procedure as outlined above. Since her last visit, she denies chest pain, shortness of breath, lower extremity edema, fatigue, palpitations, melena, hematuria, hemoptysis, diaphoresis, weakness, presyncope, syncope, orthopnea, and PND.  Today patient is doing well overall.  She is without acute cardiovascular concerns or complaints at this time.  She denies any chest pain or dyspnea on exertion.  Her  activity level is somewhat limited due to her charcot foot.  She tells me she is still able to to be active and this does not limit her in any way.  She still does house chores and exercises without exertional symptoms or limitations.  Over the last 3 weeks she has been using a knee scooter without chest pains.  Overall she is able to complete greater than 4 METS. Past Medical History    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)    Diabetes (HCC)    History of kidney stones    Medical history non-contributory    PONV (postoperative nausea and vomiting)    even though no surgeries is very prone to nausea and vomiting with many pain medications in the past           Past Surgical History:  Procedure Laterality Date   CATARACT EXTRACTION Bilateral    LEAD REVISION/REPAIR N/A 11/11/2022   Procedure: LEAD REVISION/REPAIR;  Surgeon: [name], MD;  Location: [facility];  Service: Cardiovascular;  Laterality: N/A;   NO PAST SURGERIES     ORIF HUMERUS FRACTURE Left 06/28/2018   Procedure: OPEN REDUCTION INTERNAL FIXATION (ORIF) HUMERUS FRACTURE;  Surgeon: [name], MD;  Location[facility];  Service: Orthopedics;  Laterality: Left;  120 mins   PACEMAKER IMPLANT N/A 11/10/2022   Procedure: PACEMAKER IMPLANT;  Surgeon: [name], MD;  Location: [facility];  Service: Cardiovascular;  Laterality: N/A;    Allergies  Allergies  Allergen Reactions   Percocet [Oxycodone -Acetaminophen ] Nausea  And Vomiting   Sudafed [Pseudoephedrine]     Hives    Home Medications    Prior to Admission medications   Medication Sig Start Date End Date Taking? Authorizing Provider  acetaminophen  (TYLENOL ) 325 MG tablet Take 1-2 tablets (325-650 mg total) by mouth every 4 (four) hours as needed for mild pain. 11/12/22   [name], PA-C  blood glucose meter kit and supplies Dispense  based on patient and insurance preference. Use up to four times daily as directed. (FOR ICD-10 E10.9, E11.9). 04/06/22   Lavell Bari LABOR, FNP  blood glucose meter kit and supplies E11 whatever insurance will pay for 04/07/22   Lavell Bari A, FNP  Blood Glucose Monitoring Suppl (ONE TOUCH ULTRA 2) w/Device KIT Use up to four times daily Dx E11.69 04/06/22   Lavell Bari LABOR, FNP  fluticasone  (FLONASE  ALLERGY RELIEF) 50 MCG/ACT nasal spray Place 1 spray into both nostrils daily.    [provider]  glucose blood (ONETOUCH ULTRA) test strip Test BS daily Dx E11.69 04/09/22   Lavell Bari A, FNP  ibuprofen  (ADVIL ) 200 MG tablet Take 400 mg by mouth every 6 (six) hours as needed for moderate pain.    [provider]  Lancets JANETT ULTRASOFT) lancets Test BS daily Dx E11.69 04/09/22   Lavell Bari A, FNP  loratadine  (CLARITIN ) 10 MG tablet Take 10 mg by mouth daily.    [provider]  rosuvastatin  (CRESTOR ) 5 MG tablet Take 1 tablet (5 mg total) by mouth daily. 06/04/23   Hawks, Bari A, FNP  UNABLE TO FIND Inject 1 Dose into the eye See admin instructions. Med Name: Eylea (aflibercept) eye injection every 6 weeks.    [provider]  Vitamin D , Ergocalciferol , (DRISDOL ) 1.25 MG (50000 UNIT) CAPS capsule TAKE 1 CAPSULE BY MOUTH EVERY 7 DAYS FOR 12 DOSES 09/27/23   Lavell Bari LABOR, FNP    Physical Exam    Vital Signs:  [name] does not have vital signs available for review today.  Given telephonic nature of communication, physical exam is limited. AAOx3. NAD. Normal affect.  Speech and respirations are unlabored.  Accessory Clinical Findings    None  Assessment & Plan    1.  Preoperative Cardiovascular Risk Assessment: According to the Revised Cardiac Risk Index (RCRI), her Perioperative Risk of Major Cardiac Event is (%): 0.4. Her Functional Capacity in METs is: 5.07 according to the Duke Activity Status Index (DASI).  Therefore, based on  ACC/AHA guidelines, patient would be at acceptable risk for the planned procedure without further cardiovascular testing.  The patient was advised that if she develops new symptoms prior to surgery to contact our office to arrange for a follow-up visit, and she verbalized understanding.  A copy of this note will be routed to requesting surgeon.  Time:   Today, I have spent 8 minutes with the patient with telehealth technology discussing medical history, symptoms, and management plan.     Lum LITTIE Louis, NP  12/16/2023, 2:50 PM

## 2023-12-17 ENCOUNTER — Other Ambulatory Visit: Payer: Self-pay | Admitting: Orthopedic Surgery

## 2023-12-27 ENCOUNTER — Other Ambulatory Visit: Payer: Self-pay | Admitting: Family

## 2024-01-06 ENCOUNTER — Other Ambulatory Visit: Payer: Self-pay

## 2024-01-06 ENCOUNTER — Encounter (HOSPITAL_BASED_OUTPATIENT_CLINIC_OR_DEPARTMENT_OTHER): Payer: Self-pay | Admitting: Orthopedic Surgery

## 2024-01-06 NOTE — Progress Notes (Signed)
   01/06/24 1236  Pre-op  Phone Call  Surgery Date Verified 01/13/24  Arrival Time Verified 0700  Surgery Location Verified [facility]  Medical History Reviewed Yes  Is the patient taking a GLP-1 receptor agonist? No  Does the patient have diabetes? Type II  Does the patient use a Continuous Blood Glucose Monitor? Yes  Location of sensor? Left Arm  Is the patient on an insulin  pump? No  Has the diabetes coordinator been notified? No  Do you have a history of heart problems? Yes  Cardiologist Name Dr. Inocencio  Have you ever had tests on your heart? Yes  What cardiac tests were performed? Echo;EKG  Results viewable: [facility] Media Tab  Antiarrhythmic device type Permanent Pacemaker  Does patient have other implanted devices? No  Patient Teaching Enhanced Recovery;Pre / Post Procedure  Patient educated about smoking cessation 24 hours prior to surgery. N/A Non-Smoker  Patient verbalizes understanding of bowel prep? N/A  THA/TKA patients only:  By your surgery date, will you have been taking narcotics for 90 days or greater? No  Med Rec Completed Yes  Take the Following Meds the Morning of Surgery none  Recent  Lab Work, EKG, CXR? No  NPO (Including gum & candy) After midnight  Allowed clear liquids Water;Gatorade  (diabetics please choose diet or no sugar options)  Patient instructed to stop clear liquids including Carb loading drink at: 0600  Stop Solids, Milk, Candy, and Gum STARTING AT MIDNIGHT  Did patient view EMMI videos? No  Responsible adult to drive and be with you for 24 hours? Yes  Name & Phone Number for Ride/Caregiver husband Kennith  No Jewelry, money, nail polish or make-up.  No lotions, powders, perfumes. No shaving  48 hrs. prior to surgery. Yes  Contacts, Dentures & Glasses Will Have to be Removed Before OR. Yes  Please bring your ID and Insurance Card the morning of your surgery. (Surgery Centers Only) Yes  Bring any papers or x-rays with you that your surgeon gave you. Yes   Instructed to contact the location of procedure/ provider if they or anyone in their household develops symptoms or tests positive for COVID-19, has close contact with someone who tests positive for COVID, or has known exposure to any contagious illness. Yes  Call this number the morning of surgery  with any problems that may cancel your surgery. [phone]  Covid-19 Assessment  Have you had a positive COVID-19 test within the previous 90 days? No  COVID Testing Guidance Proceed with the additional questions.  Patient's surgery required a COVID-19 test (cardiothoracic, complex ENT, and bronchoscopies/ EBUS) No  Have you been unmasked and in close contact with anyone with COVID-19 or COVID-19 symptoms within the past 10 days? No  Do you or anyone in your household currently have any COVID-19 symptoms? No   Reviewed pacemaker sheet with dr. Tilford okay for [facility]

## 2024-01-12 ENCOUNTER — Encounter (HOSPITAL_BASED_OUTPATIENT_CLINIC_OR_DEPARTMENT_OTHER)
Admission: RE | Admit: 2024-01-12 | Discharge: 2024-01-12 | Disposition: A | Discharge status: Home / Self Care | Source: Ambulatory Visit | Attending: Orthopedic Surgery | Admitting: Orthopedic Surgery

## 2024-01-12 ENCOUNTER — Encounter (HOSPITAL_BASED_OUTPATIENT_CLINIC_OR_DEPARTMENT_OTHER): Attending: Family

## 2024-01-12 DIAGNOSIS — Z950 Presence of cardiac pacemaker: Secondary | ICD-10-CM | POA: Diagnosis not present

## 2024-01-12 DIAGNOSIS — S93314A Dislocation of tarsal joint of right foot, initial encounter: Secondary | ICD-10-CM | POA: Diagnosis not present

## 2024-01-12 DIAGNOSIS — F419 Anxiety disorder, unspecified: Secondary | ICD-10-CM | POA: Diagnosis not present

## 2024-01-12 DIAGNOSIS — E11621 Type 2 diabetes mellitus with foot ulcer: Secondary | ICD-10-CM | POA: Diagnosis not present

## 2024-01-12 DIAGNOSIS — I10 Essential (primary) hypertension: Secondary | ICD-10-CM | POA: Diagnosis not present

## 2024-01-12 DIAGNOSIS — M6701 Short Achilles tendon (acquired), right ankle: Secondary | ICD-10-CM | POA: Diagnosis present

## 2024-01-12 DIAGNOSIS — X58XXXA Exposure to other specified factors, initial encounter: Secondary | ICD-10-CM | POA: Diagnosis not present

## 2024-01-12 DIAGNOSIS — E11610 Type 2 diabetes mellitus with diabetic neuropathic arthropathy: Secondary | ICD-10-CM | POA: Diagnosis not present

## 2024-01-12 LAB — BASIC METABOLIC PANEL WITH GFR
Anion gap: 12 (ref 5–15)
BUN: 28 mg/dL — ABNORMAL HIGH (ref 6–20)
CO2: 26 mmol/L (ref 22–32)
Calcium: 10.1 mg/dL (ref 8.9–10.3)
Chloride: 104 mmol/L (ref 98–111)
Creatinine, Ser: 0.88 mg/dL (ref 0.44–1.00)
GFR, Estimated: >60 mL/min (ref >60)
Glucose, Bld: 111 mg/dL — ABNORMAL HIGH (ref 70–99)
Potassium: 4.4 mmol/L (ref 3.5–5.1)
Sodium: 142 mmol/L (ref 135–145)

## 2024-01-12 LAB — SURGICAL PCR SCREEN
MRSA, PCR: NEGATIVE
Staphylococcus aureus: POSITIVE — AB

## 2024-01-12 NOTE — Progress Notes (Signed)
  G2 drink given to pt with written and verbal instruction to drink by 0445 on DOS. Pt verbalized understanding.        Enhanced Recovery after Surgery for Orthopedics Enhanced Recovery after Surgery is a protocol used to improve the stress on your body and your recovery after surgery.  Patient Instructions  The night before surgery:  No food after midnight. ONLY clear liquids after midnight  The day of surgery (if you do NOT have diabetes):  Drink ONE (1) Pre-Surgery Clear Ensure as directed.   This drink was given to you during your hospital  pre-op  appointment visit. The pre-op  nurse will instruct you on the time to drink the  Pre-Surgery Ensure depending on your surgery time. Finish the drink at the designated time by the pre-op  nurse.  Nothing else to drink after completing the  Pre-Surgery Clear Ensure.  The day of surgery (if you have diabetes): Drink ONE (1) Gatorade 2 (G2) as directed. This drink was given to you during your hospital  pre-op  appointment visit.  The pre-op  nurse will instruct you on the time to drink the   Gatorade 2 (G2) depending on your surgery time. Color of the Gatorade may vary. Red is not allowed. Nothing else to drink after completing the  Gatorade 2 (G2).         If you have questions, please contact your surgeon's office.

## 2024-01-13 ENCOUNTER — Ambulatory Visit (HOSPITAL_BASED_OUTPATIENT_CLINIC_OR_DEPARTMENT_OTHER)

## 2024-01-13 ENCOUNTER — Encounter (HOSPITAL_BASED_OUTPATIENT_CLINIC_OR_DEPARTMENT_OTHER): Admitting: Anesthesiology

## 2024-01-13 ENCOUNTER — Encounter (HOSPITAL_BASED_OUTPATIENT_CLINIC_OR_DEPARTMENT_OTHER): Payer: Self-pay | Admitting: Orthopedic Surgery

## 2024-01-13 ENCOUNTER — Other Ambulatory Visit: Payer: Self-pay

## 2024-01-13 ENCOUNTER — Encounter (HOSPITAL_BASED_OUTPATIENT_CLINIC_OR_DEPARTMENT_OTHER)
Admission: RE | Disposition: A | Discharge status: Home / Self Care | Payer: Self-pay | Source: Home / Self Care | Attending: Orthopedic Surgery

## 2024-01-13 ENCOUNTER — Ambulatory Visit (HOSPITAL_BASED_OUTPATIENT_CLINIC_OR_DEPARTMENT_OTHER): Admitting: Anesthesiology

## 2024-01-13 ENCOUNTER — Ambulatory Visit (HOSPITAL_BASED_OUTPATIENT_CLINIC_OR_DEPARTMENT_OTHER)
Admission: RE | Admit: 2024-01-13 | Discharge: 2024-01-13 | Disposition: A | Discharge status: Home / Self Care | Attending: Orthopedic Surgery | Admitting: Orthopedic Surgery

## 2024-01-13 DIAGNOSIS — X58XXXA Exposure to other specified factors, initial encounter: Secondary | ICD-10-CM | POA: Insufficient documentation

## 2024-01-13 DIAGNOSIS — S93314A Dislocation of tarsal joint of right foot, initial encounter: Secondary | ICD-10-CM | POA: Insufficient documentation

## 2024-01-13 DIAGNOSIS — M6701 Short Achilles tendon (acquired), right ankle: Secondary | ICD-10-CM | POA: Insufficient documentation

## 2024-01-13 DIAGNOSIS — E11621 Type 2 diabetes mellitus with foot ulcer: Secondary | ICD-10-CM | POA: Insufficient documentation

## 2024-01-13 DIAGNOSIS — M19071 Primary osteoarthritis, right ankle and foot: Secondary | ICD-10-CM

## 2024-01-13 DIAGNOSIS — Z01818 Encounter for other preprocedural examination: Secondary | ICD-10-CM

## 2024-01-13 DIAGNOSIS — I10 Essential (primary) hypertension: Secondary | ICD-10-CM | POA: Insufficient documentation

## 2024-01-13 DIAGNOSIS — E11610 Type 2 diabetes mellitus with diabetic neuropathic arthropathy: Secondary | ICD-10-CM | POA: Insufficient documentation

## 2024-01-13 DIAGNOSIS — F419 Anxiety disorder, unspecified: Secondary | ICD-10-CM | POA: Insufficient documentation

## 2024-01-13 DIAGNOSIS — Z950 Presence of cardiac pacemaker: Secondary | ICD-10-CM | POA: Insufficient documentation

## 2024-01-13 HISTORY — DX: Essential (primary) hypertension: I10

## 2024-01-13 HISTORY — PX: ACHILLES TENDON LENGTHENING: SHX6455

## 2024-01-13 HISTORY — PX: FOOT ARTHRODESIS: SHX1655

## 2024-01-13 HISTORY — PX: METATARSAL OSTEOTOMY: SHX1641

## 2024-01-13 LAB — GLUCOSE, CAPILLARY
Glucose-Capillary: 108 mg/dL — ABNORMAL HIGH (ref 70–99)
Glucose-Capillary: 179 mg/dL — ABNORMAL HIGH (ref 70–99)

## 2024-01-13 SURGERY — FUSION, JOINT, FOOT
Anesthesia: General | Site: Foot | Laterality: Right

## 2024-01-13 MED ORDER — DEXAMETHASONE SODIUM PHOSPHATE 10 MG/ML IJ SOLN
INTRAMUSCULAR | Status: AC
  Filled 2024-01-13: qty 1

## 2024-01-13 MED ORDER — ALBUMIN HUMAN 5 % IV SOLN: INTRAVENOUS | Status: DC | PRN

## 2024-01-13 MED ORDER — LIDOCAINE 2% (20 MG/ML) 5 ML SYRINGE
INTRAMUSCULAR | Status: DC | PRN
  Administered 2024-01-13: 80 mg via INTRAVENOUS

## 2024-01-13 MED ORDER — KETOROLAC TROMETHAMINE 30 MG/ML IJ SOLN
INTRAMUSCULAR | Status: DC | PRN
  Administered 2024-01-13: 30 mg via INTRAVENOUS

## 2024-01-13 MED ORDER — ONDANSETRON HCL 4 MG PO TABS: 4.0000 mg | ORAL_TABLET | Freq: Every day | ORAL | 0 refills | Status: AC | PRN

## 2024-01-13 MED ORDER — ACETAMINOPHEN 500 MG PO TABS
ORAL_TABLET | ORAL | Status: AC
  Filled 2024-01-13: qty 2

## 2024-01-13 MED ORDER — PROPOFOL 10 MG/ML IV BOLUS
INTRAVENOUS | Status: DC | PRN
  Administered 2024-01-13: 170 mg via INTRAVENOUS

## 2024-01-13 MED ORDER — CEFAZOLIN SODIUM-DEXTROSE 1-4 GM/50ML-% IV SOLN
INTRAVENOUS | Status: DC | PRN
  Administered 2024-01-13: 1 g via INTRAVENOUS

## 2024-01-13 MED ORDER — FENTANYL CITRATE (PF) 100 MCG/2ML IJ SOLN
INTRAMUSCULAR | Status: AC
  Filled 2024-01-13: qty 2

## 2024-01-13 MED ORDER — SENNA 8.6 MG PO TABS: 2.0000 | ORAL_TABLET | Freq: Two times a day (BID) | ORAL | 0 refills | Status: AC

## 2024-01-13 MED ORDER — LACTATED RINGERS IV SOLN: INTRAVENOUS | Status: DC

## 2024-01-13 MED ORDER — SCOPOLAMINE 1 MG/3DAYS TD PT72
MEDICATED_PATCH | TRANSDERMAL | Status: AC
  Filled 2024-01-13: qty 1

## 2024-01-13 MED ORDER — CEFAZOLIN SODIUM-DEXTROSE 2-4 GM/100ML-% IV SOLN
INTRAVENOUS | Status: AC
  Filled 2024-01-13: qty 100

## 2024-01-13 MED ORDER — MIDAZOLAM HCL 2 MG/2ML IJ SOLN
INTRAMUSCULAR | Status: AC
Start: 2024-01-13 — End: 2024-01-13
  Filled 2024-01-13: qty 2

## 2024-01-13 MED ORDER — DEXMEDETOMIDINE HCL IN NACL 80 MCG/20ML IV SOLN
INTRAVENOUS | Status: DC | PRN
  Administered 2024-01-13: 6 ug via INTRAVENOUS

## 2024-01-13 MED ORDER — SODIUM CHLORIDE 0.9 % IR SOLN
Status: DC | PRN
  Administered 2024-01-13: 1000 mL

## 2024-01-13 MED ORDER — LIDOCAINE 2% (20 MG/ML) 5 ML SYRINGE
INTRAMUSCULAR | Status: AC
Start: 2024-01-13 — End: 2024-01-13
  Filled 2024-01-13: qty 5

## 2024-01-13 MED ORDER — SCOPOLAMINE 1 MG/3DAYS TD PT72
1.0000 | MEDICATED_PATCH | TRANSDERMAL | Status: DC
  Administered 2024-01-13: 1 mg via TRANSDERMAL

## 2024-01-13 MED ORDER — ONDANSETRON HCL 4 MG/2ML IJ SOLN: 4.0000 mg | Freq: Once | INTRAMUSCULAR | Status: DC | PRN

## 2024-01-13 MED ORDER — ONDANSETRON HCL 4 MG/2ML IJ SOLN
INTRAMUSCULAR | Status: AC
  Filled 2024-01-13: qty 2

## 2024-01-13 MED ORDER — MIDAZOLAM HCL 2 MG/2ML IJ SOLN
INTRAMUSCULAR | Status: AC
  Filled 2024-01-13: qty 2

## 2024-01-13 MED ORDER — VANCOMYCIN HCL 500 MG IV SOLR
INTRAVENOUS | Status: DC | PRN
  Administered 2024-01-13: 500 mg

## 2024-01-13 MED ORDER — PHENYLEPHRINE HCL-NACL 20-0.9 MG/250ML-% IV SOLN
INTRAVENOUS | Status: DC | PRN
  Administered 2024-01-13: 60 ug/min via INTRAVENOUS

## 2024-01-13 MED ORDER — ONDANSETRON HCL 4 MG/2ML IJ SOLN
INTRAMUSCULAR | Status: DC | PRN
  Administered 2024-01-13: 4 mg via INTRAVENOUS

## 2024-01-13 MED ORDER — CEFAZOLIN SODIUM-DEXTROSE 2-4 GM/100ML-% IV SOLN
2.0000 g | INTRAVENOUS | Status: AC
  Administered 2024-01-13: 2 g via INTRAVENOUS

## 2024-01-13 MED ORDER — PHENYLEPHRINE HCL (PRESSORS) 10 MG/ML IV SOLN
INTRAVENOUS | Status: DC | PRN
Start: 2024-01-13 — End: 2024-01-13
  Administered 2024-01-13 (×11): 80 ug via INTRAVENOUS

## 2024-01-13 MED ORDER — ACETAMINOPHEN 500 MG PO TABS
1000.0000 mg | ORAL_TABLET | Freq: Once | ORAL | Status: AC
  Administered 2024-01-13: 1000 mg via ORAL

## 2024-01-13 MED ORDER — 0.9 % SODIUM CHLORIDE (POUR BTL) OPTIME
TOPICAL | Status: DC | PRN
  Administered 2024-01-13: 1000 mL
  Administered 2024-01-13: 200 mL

## 2024-01-13 MED ORDER — AMISULPRIDE (ANTIEMETIC) 5 MG/2ML IV SOLN
10.0000 mg | Freq: Once | INTRAVENOUS | Status: AC | PRN
  Administered 2024-01-13: 10 mg via INTRAVENOUS

## 2024-01-13 MED ORDER — AMISULPRIDE (ANTIEMETIC) 5 MG/2ML IV SOLN
INTRAVENOUS | Status: AC
Start: 2024-01-13 — End: 2024-01-13
  Filled 2024-01-13: qty 4

## 2024-01-13 MED ORDER — DOCUSATE SODIUM 100 MG PO CAPS: 100.0000 mg | ORAL_CAPSULE | Freq: Two times a day (BID) | ORAL | 0 refills | Status: AC

## 2024-01-13 MED ORDER — FENTANYL CITRATE (PF) 100 MCG/2ML IJ SOLN
INTRAMUSCULAR | Status: DC | PRN
  Administered 2024-01-13 (×2): 25 ug via INTRAVENOUS
  Administered 2024-01-13: 50 ug via INTRAVENOUS

## 2024-01-13 MED ORDER — RIVAROXABAN 10 MG PO TABS: 10.0000 mg | ORAL_TABLET | Freq: Every day | ORAL | 0 refills | Status: AC

## 2024-01-13 MED ORDER — FENTANYL CITRATE (PF) 100 MCG/2ML IJ SOLN: 25.0000 ug | INTRAMUSCULAR | Status: DC | PRN

## 2024-01-13 MED ORDER — PHENYLEPHRINE HCL-NACL 20-0.9 MG/250ML-% IV SOLN: INTRAVENOUS | Status: DC | PRN

## 2024-01-13 MED ORDER — DEXAMETHASONE SODIUM PHOSPHATE 10 MG/ML IJ SOLN
INTRAMUSCULAR | Status: DC | PRN
  Administered 2024-01-13: 4 mg via INTRAVENOUS

## 2024-01-13 MED ORDER — MIDAZOLAM HCL 2 MG/2ML IJ SOLN
INTRAMUSCULAR | Status: DC | PRN
  Administered 2024-01-13: 2 mg via INTRAVENOUS

## 2024-01-13 MED ORDER — OXYCODONE HCL 5 MG PO TABS: 5.0000 mg | ORAL_TABLET | Freq: Four times a day (QID) | ORAL | 0 refills | Status: AC | PRN

## 2024-01-13 MED ORDER — VANCOMYCIN HCL 500 MG IV SOLR
INTRAVENOUS | Status: AC
Start: 2024-01-13 — End: 2024-01-13
  Filled 2024-01-13: qty 10

## 2024-01-13 SURGICAL SUPPLY — 81 items
BIT DRILL CANN 3.5X200 (DRILL) IMPLANT
BIT DRILL CANN 3/16  4.6X250 (DRILL) IMPLANT
BIT TREPHINE CORING 8 (BIT) IMPLANT
BLADE AVERAGE 25X9 (BLADE) IMPLANT
BLADE LONG MED 25X9 (BLADE) IMPLANT
BLADE MICRO SAGITTAL (BLADE) IMPLANT
BLADE OSC/SAG .038X5.5 CUT EDG (BLADE) IMPLANT
BLADE SURG 15 STRL LF DISP TIS (BLADE) ×2 IMPLANT
BNDG COMPR ESMARK 6X3 LF (GAUZE/BANDAGES/DRESSINGS) IMPLANT
BNDG ELASTIC 4INX 5YD STR LF (GAUZE/BANDAGES/DRESSINGS) ×1 IMPLANT
BNDG ELASTIC 6INX 5YD STR LF (GAUZE/BANDAGES/DRESSINGS) IMPLANT
BNDG STRETCH GAUZE 3IN X12FT (GAUZE/BANDAGES/DRESSINGS) ×1 IMPLANT
BURR SURGICAL 4.3X13X72 (BURR) IMPLANT
CHLORAPREP W/TINT 26 (MISCELLANEOUS) ×1 IMPLANT
CORD BIPOLAR FORCEPS 12FT (ELECTRODE) IMPLANT
COVER BACK TABLE 60X90IN (DRAPES) ×1 IMPLANT
CUFF TRNQT CYL 24X4X16.5-23 (TOURNIQUET CUFF) IMPLANT
CUFF TRNQT CYL 34X4.125X (TOURNIQUET CUFF) IMPLANT
DRAPE EXTREMITY T 121X128X90 (DISPOSABLE) ×1 IMPLANT
DRAPE OEC MINIVIEW 54X84 (DRAPES) ×1 IMPLANT
DRAPE U-SHAPE 47X51 STRL (DRAPES) ×1 IMPLANT
DRSG MEPITEL 4X7.2 (GAUZE/BANDAGES/DRESSINGS) ×1 IMPLANT
ELECTRODE REM PT RTRN 9FT ADLT (ELECTROSURGICAL) ×1 IMPLANT
GAUZE PAD ABD 8X10 STRL (GAUZE/BANDAGES/DRESSINGS) ×2 IMPLANT
GAUZE SPONGE 4X4 12PLY STRL (GAUZE/BANDAGES/DRESSINGS) ×1 IMPLANT
GLOVE BIO SURGEON STRL SZ8 (GLOVE) ×1 IMPLANT
GLOVE BIOGEL PI IND STRL 7.0 (GLOVE) IMPLANT
GLOVE BIOGEL PI IND STRL 7.5 (GLOVE) IMPLANT
GLOVE BIOGEL PI IND STRL 8 (GLOVE) ×2 IMPLANT
GLOVE ECLIPSE 8.0 STRL XLNG CF (GLOVE) ×1 IMPLANT
GLOVE SURG SS PI 7.0 STRL IVOR (GLOVE) IMPLANT
GOWN STRL REUS W/ TWL LRG LVL3 (GOWN DISPOSABLE) ×1 IMPLANT
GOWN STRL REUS W/ TWL XL LVL3 (GOWN DISPOSABLE) ×2 IMPLANT
HANDPIECE MIS (MISCELLANEOUS) IMPLANT
IRRIGATION KIT MIS (KITS) IMPLANT
KWIRE .062X9 SMTH SNG TROCAR (MISCELLANEOUS) IMPLANT
KWIRE DBL .054X9 NSTRL (WIRE) IMPLANT
KWIRE SINGLE SMOOTH 2.3X300 (WIRE) IMPLANT
KWIRE TROCAR TIP 1.6X230 (WIRE) IMPLANT
LOOP VASCLR MAXI BLUE 18IN ST (MISCELLANEOUS) IMPLANT
LOOP VESSEL MINI RED (MISCELLANEOUS) IMPLANT
LOOPS VASCLR MAXI BLUE 18IN ST (MISCELLANEOUS) IMPLANT
NDL HYPO 22X1.5 SAFETY MO (MISCELLANEOUS) ×1 IMPLANT
NDL HYPO 25X1 1.5 SAFETY (NEEDLE) IMPLANT
NDL SAFETY ECLIPSE 18X1.5 (NEEDLE) IMPLANT
NEEDLE HYPO 22X1.5 SAFETY MO (MISCELLANEOUS) ×1 IMPLANT
NEEDLE HYPO 25X1 1.5 SAFETY (NEEDLE) IMPLANT
NS IRRIG 1000ML POUR BTL (IV SOLUTION) ×1 IMPLANT
PACK BASIN DAY SURGERY FS (CUSTOM PROCEDURE TRAY) ×1 IMPLANT
PAD CAST 4YDX4 CTTN HI CHSV (CAST SUPPLIES) ×1 IMPLANT
PADDING CAST ABS COTTON 4X4 ST (CAST SUPPLIES) IMPLANT
PADDING CAST COTTON 6X4 STRL (CAST SUPPLIES) IMPLANT
PENCIL SMOKE EVACUATOR (MISCELLANEOUS) ×1 IMPLANT
SANITIZER HAND ALTRA PUMP 550 (MISCELLANEOUS) ×1 IMPLANT
SCREW CANN FT 5.5X120 (Screw) IMPLANT
SCREW CANN HDLS 7.2X110 (Screw) IMPLANT
SCREW CANN HDLS 7.2X70 (Screw) IMPLANT
SCREW CANN HDLS 7.2X75 (Screw) IMPLANT
SCREW CANN HDLS FT 7.2X70 (Screw) IMPLANT
SCREW CANN HDLS MT 5.5X50 (Screw) IMPLANT
SCREW CANN HDLS MT 5.5X60 (Screw) IMPLANT
SHEET MEDIUM DRAPE 40X70 STRL (DRAPES) ×1 IMPLANT
SLEEVE SCD COMPRESS KNEE MED (STOCKING) ×1 IMPLANT
SPLINT PLASTER CAST FAST 5X30 (CAST SUPPLIES) IMPLANT
SPONGE SURGIFOAM ABS GEL 12-7 (HEMOSTASIS) IMPLANT
SPONGE T-LAP 18X18 ~~LOC~~+RFID (SPONGE) ×1 IMPLANT
STOCKINETTE 6 STRL (DRAPES) ×1 IMPLANT
STRIP CLOSURE SKIN 1/2X4 (GAUZE/BANDAGES/DRESSINGS) IMPLANT
SUCTION TUBE FRAZIER 10FR DISP (SUCTIONS) ×1 IMPLANT
SUT ETHILON 3 0 PS 1 (SUTURE) ×1 IMPLANT
SUT MNCRL AB 3-0 PS2 18 (SUTURE) ×1 IMPLANT
SUT VIC AB 0 SH 27 (SUTURE) IMPLANT
SUT VIC AB 2-0 SH 27XBRD (SUTURE) ×1 IMPLANT
SUT VICRYL 0 SH 27 (SUTURE) IMPLANT
SUT VICRYL 0 UR6 27IN ABS (SUTURE) IMPLANT
SYR BULB EAR ULCER 3OZ GRN STR (SYRINGE) ×1 IMPLANT
SYR CONTROL 10ML LL (SYRINGE) IMPLANT
TOWEL GREEN STERILE FF (TOWEL DISPOSABLE) ×2 IMPLANT
TUBE CONNECTING 20X1/4 (TUBING) ×1 IMPLANT
UNDERPAD 30X36 HEAVY ABSORB (UNDERPADS AND DIAPERS) ×1 IMPLANT
YANKAUER SUCT BULB TIP NO VENT (SUCTIONS) IMPLANT

## 2024-01-13 NOTE — Op Note (Signed)
 01/13/2024  12:46 PM  PATIENT:  Becky Townsend  53 y.o. female  PRE-OPERATIVE DIAGNOSIS:  1.  Right charcot foot deformity with talonavicular joint dislocation and varus malalignment      2.  Short right achilles tendon  POST-OPERATIVE DIAGNOSIS:  same  Procedure(s): Open right achilles tendon lengthening Open treatment of right talonavicular joint dislocation Open right posterior tibial tendon lengthening Right subtalar joint arthrodesis Right talonavicular and calcaneocuboid joint arthrodeses with osteotomy AP, mortise and lateral xrays of the right ankle AP, oblique and Harris heel xrays of the right foot  SURGEON:  Norleen Armor, MD  ASSISTANT: Eva Barrack, PA-C  ANESTHESIA:   General  EBL:  100 cc   TOURNIQUET:   Total Tourniquet Time Documented: Thigh (Right) - 120 minutes Total: Thigh (Right) - 120 minutes  COMPLICATIONS:  None apparent  DISPOSITION:  Extubated, awake and stable to recovery.  INDICATION FOR PROCEDURE:  54 y/o female with pmh of diabetes c/b Charcot neuroarthropathy of the right foot has developed recurrent diabetic foot ulcers despite use of a CROW boot.  She has progressive varus deformity of the hindfoot with a tight heelcord.  She has failed non op treatment and presents today for open reduction of the deformity and arthrodeses of the hindfoot with corrective osteotomies.  She also will need achilles tendon lengthening.  The risks and benefits of the alternative treatment options have been discussed in detail.  The patient wishes to proceed with surgery and specifically understands risks of bleeding, infection, nerve damage, blood clots, need for additional surgery, amputation and death.   PROCEDURE IN DETAIL:  After pre operative consent was obtained, and the correct operative site was identified, the patient was brought to the operating room and placed supine on the OR table.  Anesthesia was administered.  Pre-operative antibiotics were administered.   A surgical timeout was taken.  Right lower extremity was prepped and draped in standard sterile fashion with a tourniquet around the thigh.  The extremity was elevated, and tourniquet was inflated to 250 mmHg.  The heel cord was examined.  It was noted to be contracted.  A medial incision was made just proximal from the heel.  Dissection was carried down through the subcutaneous tissues.  A Z-lengthening of the Achilles was performed with scalpel.  The ankle was then dorsiflex 20 degrees past neutral.  Attention was turned to the hindfoot.  A curvilinear incision was made adjacent to the subtalar joint.  Dissection was carried down to the subcutaneous tissues.  There was extensive scarring noted.  The sinus Tarsi was entered.  The posterior facet was identified.  There was substantial medial subluxation of the calcaneus relative to the talus.  The subtalar joint was mobilized.  The remaining articular cartilage and subchondral bone was taken down with curettes and a rondure.  Attention was then returned to the medial side of the ankle.  An incision was made along the medial column from the navicular to the medial malleolus.  Dissection was carried down through the subcutaneous tissues.  The posterior tibial tendon was identified.  It was noted to be quite contracted and prevented reduction of the talonavicular joint.  It was lengthened in Z fashion.  The spring ligament was released.  This allowed reduction of the talonavicular joint.  Attention was returned to the lateral hindfoot.  The wound was irrigated.  The subtalar joint was reduced.  A guidepin was placed from the tuberosity into the head of the talus.  A second guidepin  was placed from the plantar aspect of the anterior process across to the remaining head of the talus.  Nearly all of the talar head was obliterated.  The more anterior pin was overdrilled.  A partially-threaded 5.5 millimeter screw was inserted compressing the subtalar joint  appropriately.  The second guidepin was overdrilled and a 5.5 mm fully threaded screw was inserted.      Attention was returned to the lateral incision.  The calcaneocuboid joint was identified.  A 4 mm conical bur was used to osteotomize the calcaneus and cuboid resecting adequate bone for reduction of the medial and lateral columns.  The reduction was provisionally pinned.  A guidepin was placed from the plantar head of the fourth metatarsal and advanced retrograde at the metatarsal shaft and into the lateral aspect of the calcaneus.  An attempt was made to place a pin from the head of the first metatarsal into the talus.  There was insufficient talus medially to allow adequate purchase with the pin.  The pin was removed.  A guidepin was then inserted from the medial cuneiform obliquely across the talonavicular joint into the head of the talus.  The fourth metatarsal pin was overdrilled.  A 5.5 mm fully threaded 120 mm long screw was inserted.  It was noted to have excellent purchase.  Medially the guidepin was overdrilled.  A 7 mm fully threaded screw was inserted and was again noted to have excellent purchase.  AP, mortise and lateral radiographs of the right ankle were obtained.  These show interval arthrodeses of the talonavicular and calcaneocuboid joints as well as the subtalar joint.  Hardware is appropriately positioned and of the appropriate lengths.  AP, lateral and Harris heel radiographs of the right foot show interval arthrodesis of the talonavicular, subtalar and talonavicular joints.  Appropriate reduction of the talonavicular joint is also noted.  Hardware is appropriately positioned and of the appropriate lengths.  The wounds were irrigated copiously.  Subcutaneous tissues were sprinkled with vancomycin  powder.  Deep subcutaneous tissues were approximated with 0 Vicryl.  Skin incisions were closed with 3-0 nylon.  Sterile dressings were applied followed by a well-padded short leg splint.   Tourniquet was released at 2 hours and hemostasis was achieved prior to closure.  FOLLOW UP PLAN: Nonweightbearing on the right lower extremity.  Follow-up in the office in 2 weeks for suture removal and conversion to a short leg cast.  Plan 3 months postoperative nonweightbearing immobilization.  Aspirin for DVT prophylaxis.   RADIOGRAPHS:AP, mortise and lateral radiographs of the right ankle were obtained.  These show interval arthrodeses of the talonavicular and calcaneocuboid joints as well as the subtalar joint.  Hardware is appropriately positioned and of the appropriate lengths.  AP, lateral and Harris heel radiographs of the right foot show interval arthrodesis of the talonavicular, subtalar and talonavicular joints.  Appropriate reduction of the talonavicular joint is also noted.  Hardware is appropriately positioned and of the appropriate lengths.    Justin Ollis PA-C was present and scrubbed for the duration of the operative case. His assistance was essential in positioning the patient, prepping and draping, gaining and maintaining exposure, performing the operation, closing and dressing the wounds and applying the splint.

## 2024-01-13 NOTE — Anesthesia Procedure Notes (Signed)
 Procedure Name: LMA Insertion Date/Time: 01/13/2024 9:01 AM  Performed by: Julieanne Fairy BROCKS, CRNAPre-anesthesia Checklist: Patient identified, Emergency Drugs available, Suction available and Patient being monitored Patient Re-evaluated:Patient Re-evaluated prior to induction Oxygen Delivery Method: Circle system utilized Preoxygenation: Pre-oxygenation with 100% oxygen Induction Type: IV induction Ventilation: Mask ventilation without difficulty LMA: LMA inserted LMA Size: 4.0 Number of attempts: 1 Airway Equipment and Method: Bite block Placement Confirmation: positive ETCO2 Tube secured with: Tape Dental Injury: Teeth and Oropharynx as per pre-operative assessment

## 2024-01-13 NOTE — Transfer of Care (Signed)
 Immediate Anesthesia Transfer of Care Note  Patient: Becky Townsend  Procedure(s) Performed: Right talonavicular and subtalar joint arthrodesis (Right: Foot) Midfoot osteotomy (Right: Foot) Heelcord lengthening (Right: Foot)  Patient Location: PACU  Anesthesia Type:MAC  Level of Consciousness: drowsy and responds to stimulation  Airway & Oxygen Therapy: Patient Spontanous Breathing and Patient connected to face mask oxygen  Post-op Assessment: Report given to RN and Post -op Vital signs reviewed and stable  Post vital signs: Reviewed and stable  Last Vitals:  Vitals Value Taken Time  BP 149/76 01/13/24 12:36  Temp    Pulse 84 01/13/24 12:43  Resp 12 01/13/24 12:43  SpO2 99 % 01/13/24 12:43  Vitals shown include unfiled device data.  Last Pain:  Vitals:   01/13/24 0708  PainSc: 0-No pain      Patients Stated Pain Goal: 3 (01/13/24 0708)  Complications: No notable events documented.

## 2024-01-13 NOTE — Discharge Instructions (Addendum)
 Norleen Armor, MD [facility]  Please read the following information regarding your care after surgery.  Medications  You only need a prescription for the narcotic pain medicine (ex. oxycodone , Percocet, Norco).  All of the other medicines listed below are available over the counter. ? Aleve 2 pills twice a day for the first 3 days after surgery. ? acetominophen (Tylenol ) 650 mg every 4-6 hours as you need for minor to moderate pain ? oxycodone  as prescribed for severe pain  Narcotic pain medicine (ex. oxycodone , Percocet, Vicodin) will cause constipation.  To prevent this problem, take the following medicines while you are taking any pain medicine. ? docusate sodium  (Colace) 100 mg twice a day ? senna (Senokot) 2 tablets twice a day  ? To help prevent blood clots, take Xarelto  as prescribed for two weeks after surgery.  You should also get up every hour while you are awake to move around.    Weight Bearing ? Do not bear any weight on the operated leg or foot.  Cast / Splint / Dressing ? Keep your splint, cast or dressing clean and dry.  Don't put anything (coat hanger, pencil, etc) down inside of it.  If it gets damp, use a hair dryer on the cool setting to dry it.  If it gets soaked, call the office to schedule an appointment for a cast change.   After your dressing, cast or splint is removed; you may shower, but do not soak or scrub the wound.  Allow the water to run over it, and then gently pat it dry.  Swelling It is normal for you to have swelling where you had surgery.  To reduce swelling and pain, keep your toes above your nose for at least 3 days after surgery.  It may be necessary to keep your foot or leg elevated for several weeks.  If it hurts, it should be elevated.  Follow Up Call my office at [phone] when you are discharged from the hospital or surgery center to schedule an appointment to be seen two weeks after surgery.  Call my office at [phone] if you develop  a fever >101.5 F, nausea, vomiting, bleeding from the surgical site or severe pain.      May take NSAIDS (ibuprofen /motrin jeronimo) after 4 pm, if needed.  May take Tylenol  after 1 pm, if needed.    Post Anesthesia Home Care Instructions  Activity: Get plenty of rest for the remainder of the day. A responsible individual must stay with you for 24 hours following the procedure.  For the next 24 hours, DO NOT: -Drive a car -Advertising copywriter -Drink alcoholic beverages -Take any medication unless instructed by your physician -Make any legal decisions or sign important papers.  Meals: Start with liquid foods such as gelatin or soup. Progress to regular foods as tolerated. Avoid greasy, spicy, heavy foods. If nausea and/or vomiting occur, drink only clear liquids until the nausea and/or vomiting subsides. Call your physician if vomiting continues.  Special Instructions/Symptoms: Your throat may feel dry or sore from the anesthesia or the breathing tube placed in your throat during surgery. If this causes discomfort, gargle with warm salt water. The discomfort should disappear within 24 hours.  If you had a scopolamine  patch placed behind your ear for the management of post- operative nausea and/or vomiting:  1. The medication in the patch is effective for 72 hours, after which it should be removed.  Wrap patch in a tissue and discard in the trash. Wash hands thoroughly  with soap and water. 2. You may remove the patch earlier than 72 hours if you experience unpleasant side effects which may include dry mouth, dizziness or visual disturbances. 3. Avoid touching the patch. Wash your hands with soap and water after contact with the patch.

## 2024-01-13 NOTE — H&P (Signed)
 Becky Townsend is an 53 y.o. female.   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  She has failed non op treatment including activity modification and CROW boot and presents today for achilles tendon lengthening, midfoot osteotomy, TN, CC and ST arthrodesis.  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)    Diabetes (HCC)    History of kidney stones    Hypertension    Medical history non-contributory    PONV (postoperative nausea and vomiting)    even though no surgeries is very prone to nausea and vomiting with many pain medications in the past            Past Surgical History:  Procedure Laterality Date   CATARACT EXTRACTION Bilateral    LEAD REVISION/REPAIR N/A 11/11/2022   Procedure: LEAD REVISION/REPAIR;  Surgeon: [name], MD;  Location: [facility];  Service: Cardiovascular;  Laterality: N/A;   NO PAST SURGERIES     ORIF HUMERUS FRACTURE Left 06/28/2018   Procedure: OPEN REDUCTION INTERNAL FIXATION (ORIF) HUMERUS FRACTURE;  Surgeon: [name], MD;  Location: [facility] OR;  Service: Orthopedics;  Laterality: Left;  120 mins   PACEMAKER IMPLANT N/A 11/10/2022   Procedure: PACEMAKER IMPLANT;  Surgeon: [name], MD;  Location: [facility];  Service: Cardiovascular;  Laterality: N/A;    Family History  Problem Relation Age of Onset   Hypertension Father    Breast cancer Neg Hx    Social History:  reports that she has never smoked. She has never used smokeless tobacco. She reports that she does not currently use alcohol. She reports that she does not use drugs.  Allergies:  Allergies  Allergen Reactions   Percocet [Oxycodone -Acetaminophen ] Nausea And Vomiting   Sudafed [Pseudoephedrine]     Hives    Medications Prior to Admission   Medication Sig Dispense Refill   loratadine  (CLARITIN ) 10 MG tablet Take 10 mg by mouth daily.     rosuvastatin  (CRESTOR ) 5 MG tablet Take 1 tablet (5 mg total) by mouth daily. 90 tablet 2   acetaminophen  (TYLENOL ) 325 MG tablet Take 1-2 tablets (325-650 mg total) by mouth every 4 (four) hours as needed for mild pain.     blood glucose meter kit and supplies Dispense based on patient and insurance preference. Use up to four times daily as directed. (FOR ICD-10 E10.9, E11.9). 1 each 0   blood glucose meter kit and supplies E11 whatever insurance will pay for 1 each 0   Blood Glucose Monitoring Suppl (ONE TOUCH ULTRA 2) w/Device KIT Use up to four times daily Dx E11.69 1 kit 0   fluticasone  (FLONASE  ALLERGY RELIEF) 50 MCG/ACT nasal spray Place 1 spray into both nostrils daily.     glucose blood (ONETOUCH ULTRA) test strip Test BS daily Dx E11.69 100 each 3   ibuprofen  (ADVIL ) 200 MG tablet Take 400 mg by mouth every 6 (six) hours as needed for moderate pain.     Lancets (ONETOUCH ULTRASOFT) lancets Test BS daily Dx E11.69 100 each 3   UNABLE TO FIND Inject 1 Dose into the eye See admin instructions. Med Name: Eylea (aflibercept) eye injection every 6 weeks.     Vitamin D , Ergocalciferol , (DRISDOL ) 1.25 MG (50000 UNIT) CAPS capsule TAKE 1 CAPSULE BY  MOUTH EVERY 7 DAYS FOR 12 DOSES 12 capsule 1    Results for orders placed or performed during the hospital encounter of Jan 16, 2024 (from the past 48 hours)  Surgical pcr screen     Status: Abnormal   Collection Time: 01/12/24  7:24 AM   Specimen: Nasal Mucosa; Nasal Swab  Result Value Ref Range   MRSA, PCR NEGATIVE NEGATIVE   Staphylococcus aureus POSITIVE (A) NEGATIVE    Comment: (NOTE) The Xpert SA Assay (FDA approved for NASAL specimens in patients 7 years of age and older), is one component of a comprehensive surveillance program. It is not intended to diagnose infection nor to guide or monitor treatment. Performed at [facility],  [address] [place], [place] [address]   Basic metabolic panel per protocol     Status: Abnormal   Collection Time: 01/12/24  9:54 AM  Result Value Ref Range   Sodium 142 135 - 145 mmol/L   Potassium 4.4 3.5 - 5.1 mmol/L   Chloride 104 98 - 111 mmol/L   CO2 26 22 - 32 mmol/L   Glucose, Bld 111 (H) 70 - 99 mg/dL    Comment: Glucose reference range applies only to samples taken after fasting for at least 8 hours.   BUN 28 (H) 6 - 20 mg/dL   Creatinine, Ser 9.11 0.44 - 1.00 mg/dL   Calcium  10.1 8.9 - 10.3 mg/dL   GFR, Estimated >39 >39 mL/min    Comment: (NOTE) Calculated using the CKD-EPI Creatinine Equation (2021)    Anion gap 12 5 - 15    Comment: Performed at [facility], [address] [place], [place] [address]  Glucose, capillary     Status: Abnormal   Collection Time: 2024-01-16  7:10 AM  Result Value Ref Range   Glucose-Capillary 108 (H) 70 - 99 mg/dL    Comment: Glucose reference range applies only to samples taken after fasting for at least 8 hours.   No results found.  Review of Systems  no recent f/c/n/v/wt loss  Blood pressure (!) 154/91, pulse 90, height 5' 1 (1.549 m), weight 78.1 kg, last menstrual period 04/06/2021, SpO2 98%. Physical Exam  Wn wd woman in nad.  A and O.  EOMI.  REsp unlaobred.  R foot with varus malalignment and tight heelcord.  Lateral ulcer is healed.  Pulses are palpable.  Decreased sens to LT at the entire foot and ankle.  Active PF and DF of the ankle and toes.  Assessment/Plan R short achilles and charcot foot deformity.  The risks and benefits of the alternative treatment options have been discussed in detail.  The patient wishes to proceed with surgery and specifically understands risks of bleeding, infection, nerve damage, blood clots, need for additional surgery, amputation and death.   Norleen Armor, MD Jan 16, 2024, 7:26 AM

## 2024-01-13 NOTE — Anesthesia Preprocedure Evaluation (Addendum)
 Anesthesia Evaluation  Patient identified by MRN, date of birth, ID band Patient awake    Reviewed: Allergy & Precautions, NPO status , Patient's Chart, lab work & pertinent test results  History of Anesthesia Complications (+) PONV and history of anesthetic complications  Airway Mallampati: II  TM Distance: >3 FB Neck ROM: Full    Dental  (+) Teeth Intact, Dental Advisory Given   Pulmonary neg pulmonary ROS   Pulmonary exam normal breath sounds clear to auscultation       Cardiovascular hypertension, (-) angina Normal cardiovascular exam+ pacemaker  Rhythm:Regular Rate:Normal  intermittent complete heart block s/p PPM   Neuro/Psych  PSYCHIATRIC DISORDERS Anxiety      Neuromuscular disease    GI/Hepatic negative GI ROS, Neg liver ROS,,,  Endo/Other  diabetes, Well Controlled, Type 2    Renal/GU negative Renal ROS     Musculoskeletal  (+) Arthritis ,  Achilles tendon contracture, right   Abdominal   Peds  Hematology negative hematology ROS (+)   Anesthesia Other Findings Day of surgery medications reviewed with the patient.  Reproductive/Obstetrics                              Anesthesia Physical Anesthesia Plan  ASA: 2  Anesthesia Plan: General   Post-op Pain Management: Tylenol  PO (pre-op )* and Toradol  IV (intra-op)*   Induction: Intravenous  PONV Risk Score and Plan: 4 or greater and Midazolam , Dexamethasone  and Ondansetron   Airway Management Planned: LMA  Additional Equipment:   Intra-op Plan:   Post-operative Plan: Extubation in OR  Informed Consent: I have reviewed the patients History and Physical, chart, labs and discussed the procedure including the risks, benefits and alternatives for the proposed anesthesia with the patient or authorized representative who has indicated his/her understanding and acceptance.     Dental advisory given  Plan Discussed with:  CRNA  Anesthesia Plan Comments: (Consented for post-op block if necessary. Patient with limited sensation in right foot.)         Anesthesia Quick Evaluation

## 2024-01-14 NOTE — Anesthesia Postprocedure Evaluation (Signed)
 Anesthesia Post Note  Patient: Becky Townsend  Procedure(s) Performed: Right talonavicular and subtalar joint arthrodesis (Right: Foot) Midfoot osteotomy (Right: Foot) Heelcord lengthening (Right: Foot)     Patient location during evaluation: PACU Anesthesia Type: General Level of consciousness: awake and alert Pain management: pain level controlled Vital Signs Assessment: post-procedure vital signs reviewed and stable Respiratory status: spontaneous breathing, nonlabored ventilation and respiratory function stable Cardiovascular status: blood pressure returned to baseline and stable Postop Assessment: no apparent nausea or vomiting Anesthetic complications: no   No notable events documented.  Last Vitals:  Vitals:   01/13/24 1345 01/13/24 1358  BP: 111/69 123/75  Pulse: 81 81  Resp: 12 16  Temp:  (!) 36.1 C  SpO2: 96% 95%    Last Pain:                 Garnette FORBES Skillern

## 2024-01-17 ENCOUNTER — Encounter (HOSPITAL_BASED_OUTPATIENT_CLINIC_OR_DEPARTMENT_OTHER): Payer: Self-pay | Admitting: Orthopedic Surgery

## 2024-02-15 ENCOUNTER — Ambulatory Visit (INDEPENDENT_AMBULATORY_CARE_PROVIDER_SITE_OTHER): Payer: 59

## 2024-02-15 DIAGNOSIS — I442 Atrioventricular block, complete: Secondary | ICD-10-CM | POA: Diagnosis not present

## 2024-02-15 LAB — CUP PACEART REMOTE DEVICE CHECK
Battery Remaining Longevity: 166 mo
Battery Voltage: 3.05 V
Brady Statistic AP VP Percent: 0 %
Brady Statistic AP VS Percent: 0 %
Brady Statistic AS VP Percent: 0.06 %
Brady Statistic AS VS Percent: 99.94 %
Brady Statistic RA Percent Paced: 0.01 %
Brady Statistic RV Percent Paced: 0.06 %
Date Time Interrogation Session: 20250922194541
Implantable Lead Connection Status: 753985
Implantable Lead Connection Status: 753985
Implantable Lead Implant Date: 20240618
Implantable Lead Implant Date: 20240619
Implantable Lead Location: 753859
Implantable Lead Location: 753860
Implantable Lead Model: 3830
Implantable Lead Model: 5076
Implantable Pulse Generator Implant Date: 20240618
Lead Channel Impedance Value: 323 Ohm
Lead Channel Impedance Value: 380 Ohm
Lead Channel Impedance Value: 475 Ohm
Lead Channel Impedance Value: 608 Ohm
Lead Channel Pacing Threshold Amplitude: 0.625 V
Lead Channel Pacing Threshold Amplitude: 1 V
Lead Channel Pacing Threshold Pulse Width: 0.4 ms
Lead Channel Pacing Threshold Pulse Width: 0.4 ms
Lead Channel Sensing Intrinsic Amplitude: 17.5 mV
Lead Channel Sensing Intrinsic Amplitude: 17.5 mV
Lead Channel Sensing Intrinsic Amplitude: 2.375 mV
Lead Channel Sensing Intrinsic Amplitude: 2.375 mV
Lead Channel Setting Pacing Amplitude: 1.5 V
Lead Channel Setting Pacing Amplitude: 2 V
Lead Channel Setting Pacing Pulse Width: 0.4 ms
Lead Channel Setting Sensing Sensitivity: 1.2 mV
Zone Setting Status: 755011

## 2024-02-16 ENCOUNTER — Ambulatory Visit: Payer: Self-pay | Admitting: Cardiology

## 2024-02-16 NOTE — Progress Notes (Signed)
 Remote PPM Transmission

## 2024-03-02 ENCOUNTER — Other Ambulatory Visit: Payer: Self-pay | Admitting: Family

## 2024-03-02 DIAGNOSIS — E1169 Type 2 diabetes mellitus with other specified complication: Secondary | ICD-10-CM

## 2024-04-17 ENCOUNTER — Ambulatory Visit (INDEPENDENT_AMBULATORY_CARE_PROVIDER_SITE_OTHER): Payer: Self-pay | Admitting: Family

## 2024-04-17 ENCOUNTER — Encounter: Payer: Self-pay | Admitting: Family

## 2024-04-17 VITALS — BP 132/80 | HR 96 | Temp 97.0°F | Ht 61.0 in | Wt 173.6 lb

## 2024-04-17 DIAGNOSIS — Z23 Encounter for immunization: Secondary | ICD-10-CM

## 2024-04-17 DIAGNOSIS — E1169 Type 2 diabetes mellitus with other specified complication: Secondary | ICD-10-CM | POA: Diagnosis not present

## 2024-04-17 DIAGNOSIS — I10 Essential (primary) hypertension: Secondary | ICD-10-CM

## 2024-04-17 DIAGNOSIS — M14679 Charcot's joint, unspecified ankle and foot: Secondary | ICD-10-CM

## 2024-04-17 DIAGNOSIS — I459 Conduction disorder, unspecified: Secondary | ICD-10-CM | POA: Diagnosis not present

## 2024-04-17 DIAGNOSIS — E785 Hyperlipidemia, unspecified: Secondary | ICD-10-CM

## 2024-04-17 LAB — BAYER DCA HB A1C WAIVED: HB A1C (BAYER DCA - WAIVED): 5.1 % (ref 4.8–5.6)

## 2024-04-17 NOTE — Patient Instructions (Signed)
 Hypertension, Adult High blood pressure (hypertension) is when the force of blood pumping through the arteries is too strong. The arteries are the blood vessels that carry blood from the heart throughout the body. Hypertension forces the heart to work harder to pump blood and may cause arteries to become narrow or stiff. Untreated or uncontrolled hypertension can lead to a heart attack, heart failure, a stroke, kidney disease, and other problems. A blood pressure reading consists of a higher number over a lower number. Ideally, your blood pressure should be below 120/80. The first ("top") number is called the systolic pressure. It is a measure of the pressure in your arteries as your heart beats. The second ("bottom") number is called the diastolic pressure. It is a measure of the pressure in your arteries as the heart relaxes. What are the causes? The exact cause of this condition is not known. There are some conditions that result in high blood pressure. What increases the risk? Certain factors may make you more likely to develop high blood pressure. Some of these risk factors are under your control, including: Smoking. Not getting enough exercise or physical activity. Being overweight. Having too much fat, sugar, calories, or salt (sodium) in your diet. Drinking too much alcohol. Other risk factors include: Having a personal history of heart disease, diabetes, high cholesterol, or kidney disease. Stress. Having a family history of high blood pressure and high cholesterol. Having obstructive sleep apnea. Age. The risk increases with age. What are the signs or symptoms? High blood pressure may not cause symptoms. Very high blood pressure (hypertensive crisis) may cause: Headache. Fast or irregular heartbeats (palpitations). Shortness of breath. Nosebleed. Nausea and vomiting. Vision changes. Severe chest pain, dizziness, and seizures. How is this diagnosed? This condition is diagnosed by  measuring your blood pressure while you are seated, with your arm resting on a flat surface, your legs uncrossed, and your feet flat on the floor. The cuff of the blood pressure monitor will be placed directly against the skin of your upper arm at the level of your heart. Blood pressure should be measured at least twice using the same arm. Certain conditions can cause a difference in blood pressure between your right and left arms. If you have a high blood pressure reading during one visit or you have normal blood pressure with other risk factors, you may be asked to: Return on a different day to have your blood pressure checked again. Monitor your blood pressure at home for 1 week or longer. If you are diagnosed with hypertension, you may have other blood or imaging tests to help your health care provider understand your overall risk for other conditions. How is this treated? This condition is treated by making healthy lifestyle changes, such as eating healthy foods, exercising more, and reducing your alcohol intake. You may be referred for counseling on a healthy diet and physical activity. Your health care provider may prescribe medicine if lifestyle changes are not enough to get your blood pressure under control and if: Your systolic blood pressure is above 130. Your diastolic blood pressure is above 80. Your personal target blood pressure may vary depending on your medical conditions, your age, and other factors. Follow these instructions at home: Eating and drinking  Eat a diet that is high in fiber and potassium, and low in sodium, added sugar, and fat. An example of this eating plan is called the DASH diet. DASH stands for Dietary Approaches to Stop Hypertension. To eat this way: Eat  plenty of fresh fruits and vegetables. Try to fill one half of your plate at each meal with fruits and vegetables. Eat whole grains, such as whole-wheat pasta, brown rice, or whole-grain bread. Fill about one  fourth of your plate with whole grains. Eat or drink low-fat dairy products, such as skim milk or low-fat yogurt. Avoid fatty cuts of meat, processed or cured meats, and poultry with skin. Fill about one fourth of your plate with lean proteins, such as fish, chicken without skin, beans, eggs, or tofu. Avoid pre-made and processed foods. These tend to be higher in sodium, added sugar, and fat. Reduce your daily sodium intake. Many people with hypertension should eat less than 1,500 mg of sodium a day. Do not drink alcohol if: Your health care provider tells you not to drink. You are pregnant, may be pregnant, or are planning to become pregnant. If you drink alcohol: Limit how much you have to: 0-1 drink a day for women. 0-2 drinks a day for men. Know how much alcohol is in your drink. In the [place] one drink equals one 12 oz bottle of beer (355 mL), one 5 oz glass of wine (148 mL), or one 1 oz glass of hard liquor (44 mL). Lifestyle  Work with your health care provider to maintain a healthy body weight or to lose weight. Ask what an ideal weight is for you. Get at least 30 minutes of exercise that causes your heart to beat faster (aerobic exercise) most days of the week. Activities may include walking, swimming, or biking. Include exercise to strengthen your muscles (resistance exercise), such as Pilates or lifting weights, as part of your weekly exercise routine. Try to do these types of exercises for 30 minutes at least 3 days a week. Do not use any products that contain nicotine or tobacco. These products include cigarettes, chewing tobacco, and vaping devices, such as e-cigarettes. If you need help quitting, ask your health care provider. Monitor your blood pressure at home as told by your health care provider. Keep all follow-up visits. This is important. Medicines Take over-the-counter and prescription medicines only as told by your health care provider. Follow directions carefully. Blood  pressure medicines must be taken as prescribed. Do not skip doses of blood pressure medicine. Doing this puts you at risk for problems and can make the medicine less effective. Ask your health care provider about side effects or reactions to medicines that you should watch for. Contact a health care provider if you: Think you are having a reaction to a medicine you are taking. Have headaches that keep coming back (recurring). Feel dizzy. Have swelling in your ankles. Have trouble with your vision. Get help right away if you: Develop a severe headache or confusion. Have unusual weakness or numbness. Feel faint. Have severe pain in your chest or abdomen. Vomit repeatedly. Have trouble breathing. These symptoms may be an emergency. Get help right away. Call 911. Do not wait to see if the symptoms will go away. Do not drive yourself to the hospital. Summary Hypertension is when the force of blood pumping through your arteries is too strong. If this condition is not controlled, it may put you at risk for serious complications. Your personal target blood pressure may vary depending on your medical conditions, your age, and other factors. For most people, a normal blood pressure is less than 120/80. Hypertension is treated with lifestyle changes, medicines, or a combination of both. Lifestyle changes include losing weight, eating a healthy,  low-sodium diet, exercising more, and limiting alcohol. This information is not intended to replace advice given to you by your health care provider. Make sure you discuss any questions you have with your health care provider. Document Revised: 03/18/2021 Document Reviewed: 03/18/2021 Elsevier Patient Education  2024 [employer].

## 2024-04-17 NOTE — Progress Notes (Signed)
 Subjective:    Patient ID: Becky Townsend, female    DOB: [birth year], 53 y.o.   MRN: [id number]  Chief Complaint  Patient presents with   Medical Management of Chronic Issues   Pt presents to the office today for  chronic follow up.   She has been diagnosed charcot of right foot. She has seen Ortho and is in a boot.   She is followed by Cardiologists annually for heart block and has pacemaker in place.     She is a diabetic, but since finding out she has changed her diet. She has lost 53 lbs since Feb 2023. Her weight is stable.      04/17/2024    2:01 PM 01/13/2024    7:08 AM 01/06/2024   12:36 PM  Last 3 Weights  Weight (lbs) 173 lb 9.6 oz 172 lb 2.9 oz 175 lb  Weight (kg) 78.744 kg 78.1 kg 79.379 kg     Hypertension This is a chronic problem. The current episode started more than 1 year ago. The problem has been waxing and waning since onset. The problem is uncontrolled. Associated symptoms include blurred vision and peripheral edema (right ankle). Pertinent negatives include no malaise/fatigue or shortness of breath. Risk factors for coronary artery disease include dyslipidemia, diabetes mellitus, obesity and sedentary lifestyle. The current treatment provides moderate improvement. Hypertensive end-organ damage includes retinopathy.  Hyperlipidemia This is a chronic problem. The current episode started more than 1 year ago. The problem is controlled. Recent lipid tests were reviewed and are normal. Exacerbating diseases include obesity. Pertinent negatives include no shortness of breath. Current antihyperlipidemic treatment includes statins. The current treatment provides moderate improvement of lipids. Risk factors for coronary artery disease include diabetes mellitus, dyslipidemia, hypertension, a sedentary lifestyle and obesity.  Diabetes She presents for her follow-up diabetic visit. She has type 2 diabetes mellitus. Associated symptoms include blurred vision and foot  paresthesias. Symptoms are stable. Diabetic complications include heart disease, peripheral neuropathy and retinopathy. Risk factors for coronary artery disease include diabetes mellitus, dyslipidemia, hypertension, sedentary lifestyle and post-menopausal. She is following a generally healthy and diabetic diet. Her overall blood glucose range is 110-130 mg/dl. Eye exam is current.      Review of Systems  Constitutional:  Negative for malaise/fatigue.  Eyes:  Positive for blurred vision.  Respiratory:  Negative for shortness of breath.   All other systems reviewed and are negative.  Family History  Problem Relation Age of Onset   Hypertension Father    Breast cancer Neg Hx    Social History   Socioeconomic History   Marital status: Married    Spouse name: Not on file   Number of children: Not on file   Years of education: Not on file   Highest education level: 12th grade  Occupational History   Not on file  Tobacco Use   Smoking status: Never   Smokeless tobacco: Never  Vaping Use   Vaping status: Never Used  Substance and Sexual Activity   Alcohol use: Not Currently    Comment: social only not often at all   Drug use: Never   Sexual activity: Yes    Birth control/protection: None  Other Topics Concern   Not on file  Social History Narrative   Right handed   Lives at home with husband    Caffeine- none   Social Drivers of Health   Financial Resource Strain: Low Risk  (06/04/2023)   Overall Financial Resource Strain (CARDIA)  Difficulty of Paying Living Expenses: Not very hard  Food Insecurity: No Food Insecurity (06/04/2023)   Hunger Vital Sign    Worried About Running Out of Food in the Last Year: Never true    Ran Out of Food in the Last Year: Never true  Transportation Needs: No Transportation Needs (06/04/2023)   PRAPARE - Administrator, Civil Service (Medical): No    Lack of Transportation (Non-Medical): No  Physical Activity: Insufficiently  Active (06/04/2023)   Exercise Vital Sign    Days of Exercise per Week: 5 days    Minutes of Exercise per Session: 20 min  Stress: No Stress Concern Present (06/04/2023)   [employer] of Occupational Health - Occupational Stress Questionnaire    Feeling of Stress : Only a little  Social Connections: Socially Integrated (06/04/2023)   Social Connection and Isolation Panel    Frequency of Communication with Friends and Family: More than three times a week    Frequency of Social Gatherings with Friends and Family: More than three times a week    Attends Religious Services: More than 4 times per year    Active Member of [employer] or Organizations: Yes    Attends Banker Meetings: 1 to 4 times per year    Marital Status: Married  Catering Manager Violence: Not At Risk (10/05/2022)   Humiliation, Afraid, Rape, and Kick questionnaire    Fear of Current or Ex-Partner: No    Emotionally Abused: No    Physically Abused: No    Sexually Abused: No       Objective:   Physical Exam Vitals reviewed.  Constitutional:      General: She is not in acute distress.    Appearance: She is well-developed. She is obese.  HENT:     Head: Normocephalic and atraumatic.     Right Ear: Tympanic membrane normal.     Left Ear: Tympanic membrane normal.  Eyes:     Pupils: Pupils are equal, round, and reactive to light.  Neck:     Thyroid : No thyromegaly.  Cardiovascular:     Rate and Rhythm: Normal rate and regular rhythm.     Heart sounds: Normal heart sounds. No murmur heard. Pulmonary:     Effort: Pulmonary effort is normal. No respiratory distress.     Breath sounds: Normal breath sounds. No wheezing.  Abdominal:     General: Bowel sounds are normal. There is no distension.     Palpations: Abdomen is soft.     Tenderness: There is no abdominal tenderness.  Musculoskeletal:        General: Tenderness and deformity (left boot on foot) present. Normal range of motion.     Cervical back:  Normal range of motion and neck supple.     Comments: Boot on right foot  Skin:    General: Skin is warm and dry.  Neurological:     Mental Status: She is alert and oriented to person, place, and time.     Cranial Nerves: No cranial nerve deficit.     Deep Tendon Reflexes: Reflexes are normal and symmetric.  Psychiatric:        Behavior: Behavior normal.        Thought Content: Thought content normal.        Judgment: Judgment normal.      BP (!) 151/84   Pulse 96   Temp (!) 97 F (36.1 C) (Temporal)   Ht 5' 1 (1.549 m)   Wt  173 lb 9.6 oz (78.7 kg)   LMP 04/06/2021   SpO2 100%   BMI 32.80 kg/m      Assessment & Plan:  NASTACIA RAYBUCK comes in today with chief complaint of Medical Management of Chronic Issues   Diagnosis and orders addressed: 1. Encounter for immunization - Flu vaccine trivalent PF, 6mos and older(Flulaval,Afluria,Fluarix,Fluzone) - CMP14+EGFR - CBC with Differential/Platelet  2. Type 2 diabetes mellitus with other specified complication, without long-term current use of insulin  (HCC) (Primary) - CMP14+EGFR - CBC with Differential/Platelet - Bayer DCA Hb A1c Waived  3. Charcot arthropathy of midfoot - CMP14+EGFR - CBC with Differential/Platelet  4. Heart block - CMP14+EGFR - CBC with Differential/Platelet  5. Primary hypertension - CMP14+EGFR - CBC with Differential/Platelet  6. Hyperlipidemia associated with type 2 diabetes mellitus (HCC) - CMP14+EGFR - CBC with Differential/Platelet   Labs pending Continue current medications  Health Maintenance reviewed Diet and exercise encouraged  Follow up plan: 4 months    Bari Learn, FNP

## 2024-04-18 ENCOUNTER — Ambulatory Visit: Payer: Self-pay | Admitting: Family

## 2024-04-18 ENCOUNTER — Other Ambulatory Visit: Payer: Self-pay | Admitting: Family

## 2024-04-18 LAB — CBC WITH DIFFERENTIAL/PLATELET
Basophils Absolute: 0 x10E3/uL (ref 0.0–0.2)
Basos: 1 %
EOS (ABSOLUTE): 0.2 x10E3/uL (ref 0.0–0.4)
Eos: 3 %
Hematocrit: 35 % (ref 34.0–46.6)
Hemoglobin: 10.9 g/dL — ABNORMAL LOW (ref 11.1–15.9)
Immature Grans (Abs): 0 x10E3/uL (ref 0.0–0.1)
Immature Granulocytes: 0 %
Lymphocytes Absolute: 1.5 x10E3/uL (ref 0.7–3.1)
Lymphs: 19 %
MCH: 28.6 pg (ref 26.6–33.0)
MCHC: 31.1 g/dL — ABNORMAL LOW (ref 31.5–35.7)
MCV: 92 fL (ref 79–97)
Monocytes Absolute: 0.5 x10E3/uL (ref 0.1–0.9)
Monocytes: 7 %
Neutrophils Absolute: 5.4 x10E3/uL (ref 1.4–7.0)
Neutrophils: 70 %
Platelets: 266 x10E3/uL (ref 150–450)
RBC: 3.81 x10E6/uL (ref 3.77–5.28)
RDW: 13.2 % (ref 11.7–15.4)
WBC: 7.7 x10E3/uL (ref 3.4–10.8)

## 2024-04-18 LAB — CMP14+EGFR
ALT: 12 IU/L (ref 0–32)
AST: 16 IU/L (ref 0–40)
Albumin: 4.1 g/dL (ref 3.8–4.9)
Alkaline Phosphatase: 145 IU/L — ABNORMAL HIGH (ref 49–135)
BUN/Creatinine Ratio: 28 — ABNORMAL HIGH (ref 9–23)
BUN: 23 mg/dL (ref 6–24)
Bilirubin Total: 0.4 mg/dL (ref 0.0–1.2)
CO2: 25 mmol/L (ref 20–29)
Calcium: 9.5 mg/dL (ref 8.7–10.2)
Chloride: 104 mmol/L (ref 96–106)
Creatinine, Ser: 0.82 mg/dL (ref 0.57–1.00)
Globulin, Total: 2.4 g/dL (ref 1.5–4.5)
Glucose: 122 mg/dL — ABNORMAL HIGH (ref 70–99)
Potassium: 4.7 mmol/L (ref 3.5–5.2)
Sodium: 141 mmol/L (ref 134–144)
Total Protein: 6.5 g/dL (ref 6.0–8.5)
eGFR: 84 mL/min/1.73 (ref >59)

## 2024-05-15 LAB — OPHTHALMOLOGY REPORT-SCANNED

## 2024-05-16 ENCOUNTER — Ambulatory Visit: Payer: 59

## 2024-05-16 DIAGNOSIS — I442 Atrioventricular block, complete: Secondary | ICD-10-CM | POA: Diagnosis not present

## 2024-05-17 LAB — CUP PACEART REMOTE DEVICE CHECK
Battery Remaining Longevity: 163 mo
Battery Voltage: 3.04 V
Brady Statistic AP VP Percent: 0 %
Brady Statistic AP VS Percent: 0 %
Brady Statistic AS VP Percent: 0.06 %
Brady Statistic AS VS Percent: 99.94 %
Brady Statistic RA Percent Paced: 0.01 %
Brady Statistic RV Percent Paced: 0.06 %
Date Time Interrogation Session: 20251222221148
Implantable Lead Connection Status: 753985
Implantable Lead Connection Status: 753985
Implantable Lead Implant Date: 20240618
Implantable Lead Implant Date: 20240619
Implantable Lead Location: 753859
Implantable Lead Location: 753860
Implantable Lead Model: 3830
Implantable Lead Model: 5076
Implantable Pulse Generator Implant Date: 20240618
Lead Channel Impedance Value: 304 Ohm
Lead Channel Impedance Value: 361 Ohm
Lead Channel Impedance Value: 494 Ohm
Lead Channel Impedance Value: 570 Ohm
Lead Channel Pacing Threshold Amplitude: 0.5 V
Lead Channel Pacing Threshold Amplitude: 0.75 V
Lead Channel Pacing Threshold Pulse Width: 0.4 ms
Lead Channel Pacing Threshold Pulse Width: 0.4 ms
Lead Channel Sensing Intrinsic Amplitude: 18.25 mV
Lead Channel Sensing Intrinsic Amplitude: 18.25 mV
Lead Channel Sensing Intrinsic Amplitude: 2.875 mV
Lead Channel Sensing Intrinsic Amplitude: 2.875 mV
Lead Channel Setting Pacing Amplitude: 1.5 V
Lead Channel Setting Pacing Amplitude: 2 V
Lead Channel Setting Pacing Pulse Width: 0.4 ms
Lead Channel Setting Sensing Sensitivity: 1.2 mV
Zone Setting Status: 755011

## 2024-05-17 NOTE — Progress Notes (Signed)
 Remote PPM Transmission

## 2024-05-19 ENCOUNTER — Ambulatory Visit: Payer: Self-pay | Admitting: Cardiology

## 2024-08-15 ENCOUNTER — Ambulatory Visit: Admitting: Family
